# Patient Record
Sex: Female | Born: 1965 | Race: White | Hispanic: No | Smoking: Former smoker
Health system: Southern US, Community
[De-identification: ages and names within clinical notes are randomized; demographics above are authoritative.]

## PROBLEM LIST (undated history)

## (undated) DIAGNOSIS — F17200 Nicotine dependence, unspecified, uncomplicated: Secondary | ICD-10-CM

## (undated) DIAGNOSIS — L719 Rosacea, unspecified: Secondary | ICD-10-CM

## (undated) DIAGNOSIS — T7840XA Allergy, unspecified, initial encounter: Secondary | ICD-10-CM

## (undated) HISTORY — PX: TUBAL LIGATION: SHX77

## (undated) HISTORY — DX: Nicotine dependence, unspecified, uncomplicated: F17.200

## (undated) HISTORY — DX: Rosacea, unspecified: L71.9

## (undated) HISTORY — PX: BUNIONECTOMY: SHX129

## (undated) HISTORY — DX: Allergy, unspecified, initial encounter: T78.40XA

## (undated) HISTORY — PX: TONSILLECTOMY: SUR1361

---

## 2011-04-21 ENCOUNTER — Emergency Department (HOSPITAL_COMMUNITY)
Admission: EM | Admit: 2011-04-21 | Discharge: 2011-04-21 | Disposition: A | Discharge status: Home / Self Care | Payer: No Typology Code available for payment source | Attending: Emergency Medicine | Admitting: Emergency Medicine

## 2011-04-21 ENCOUNTER — Emergency Department (HOSPITAL_COMMUNITY): Payer: No Typology Code available for payment source

## 2011-04-21 ENCOUNTER — Encounter: Payer: Self-pay | Admitting: *Deleted

## 2011-04-21 DIAGNOSIS — S139XXA Sprain of joints and ligaments of unspecified parts of neck, initial encounter: Secondary | ICD-10-CM | POA: Insufficient documentation

## 2011-04-21 DIAGNOSIS — S0510XA Contusion of eyeball and orbital tissues, unspecified eye, initial encounter: Secondary | ICD-10-CM | POA: Insufficient documentation

## 2011-04-21 DIAGNOSIS — M545 Low back pain, unspecified: Secondary | ICD-10-CM | POA: Insufficient documentation

## 2011-04-21 DIAGNOSIS — R220 Localized swelling, mass and lump, head: Secondary | ICD-10-CM | POA: Insufficient documentation

## 2011-04-21 DIAGNOSIS — S0990XA Unspecified injury of head, initial encounter: Secondary | ICD-10-CM | POA: Insufficient documentation

## 2011-04-21 DIAGNOSIS — R51 Headache: Secondary | ICD-10-CM | POA: Insufficient documentation

## 2011-04-21 DIAGNOSIS — S0120XA Unspecified open wound of nose, initial encounter: Secondary | ICD-10-CM | POA: Insufficient documentation

## 2011-04-21 DIAGNOSIS — IMO0002 Reserved for concepts with insufficient information to code with codable children: Secondary | ICD-10-CM | POA: Insufficient documentation

## 2011-04-21 DIAGNOSIS — R040 Epistaxis: Secondary | ICD-10-CM | POA: Insufficient documentation

## 2011-04-21 DIAGNOSIS — S0033XA Contusion of nose, initial encounter: Secondary | ICD-10-CM

## 2011-04-21 DIAGNOSIS — S0003XA Contusion of scalp, initial encounter: Secondary | ICD-10-CM | POA: Insufficient documentation

## 2011-04-21 DIAGNOSIS — M533 Sacrococcygeal disorders, not elsewhere classified: Secondary | ICD-10-CM | POA: Insufficient documentation

## 2011-04-21 DIAGNOSIS — M542 Cervicalgia: Secondary | ICD-10-CM | POA: Insufficient documentation

## 2011-04-21 DIAGNOSIS — H5789 Other specified disorders of eye and adnexa: Secondary | ICD-10-CM | POA: Insufficient documentation

## 2011-04-21 DIAGNOSIS — S161XXA Strain of muscle, fascia and tendon at neck level, initial encounter: Secondary | ICD-10-CM

## 2011-04-21 MED ORDER — DIAZEPAM 5 MG PO TABS
5.0000 mg | ORAL_TABLET | Freq: Once | ORAL | Status: AC
  Administered 2011-04-21: 5 mg via ORAL
  Filled 2011-04-21: qty 1

## 2011-04-21 MED ORDER — HYDROCODONE-ACETAMINOPHEN 5-325 MG PO TABS: 2.0000 | ORAL_TABLET | ORAL | Status: AC | PRN

## 2011-04-21 MED ORDER — OXYCODONE-ACETAMINOPHEN 5-325 MG PO TABS
1.0000 | ORAL_TABLET | Freq: Once | ORAL | Status: AC
  Administered 2011-04-21: 1 via ORAL
  Filled 2011-04-21: qty 1

## 2011-04-21 MED ORDER — DIAZEPAM 5 MG PO TABS: 5.0000 mg | ORAL_TABLET | Freq: Three times a day (TID) | ORAL | Status: AC | PRN

## 2011-04-21 MED ORDER — IBUPROFEN 800 MG PO TABS: 800.0000 mg | ORAL_TABLET | Freq: Three times a day (TID) | ORAL | Status: AC
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
## 2011-04-21 NOTE — ED Notes (Signed)
PT back from CT
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
## 2011-04-21 NOTE — ED Provider Notes (Signed)
Evaluation and management procedures were performed by the PA/NP under my supervision/collaboration.   Dione Booze, MD 04/21/11 2024

## 2014-12-31 DIAGNOSIS — J309 Allergic rhinitis, unspecified: Secondary | ICD-10-CM | POA: Insufficient documentation

## 2014-12-31 DIAGNOSIS — L719 Rosacea, unspecified: Secondary | ICD-10-CM | POA: Insufficient documentation

## 2014-12-31 DIAGNOSIS — F17200 Nicotine dependence, unspecified, uncomplicated: Secondary | ICD-10-CM | POA: Insufficient documentation

## 2015-01-05 ENCOUNTER — Encounter: Payer: Self-pay | Admitting: Unknown Physician Specialty

## 2015-01-05 ENCOUNTER — Ambulatory Visit (INDEPENDENT_AMBULATORY_CARE_PROVIDER_SITE_OTHER): Payer: 59 | Admitting: Unknown Physician Specialty

## 2015-01-05 VITALS — BP 104/72 | HR 67 | Temp 98.3°F | Ht 65.9 in | Wt 141.0 lb

## 2015-01-05 DIAGNOSIS — Z720 Tobacco use: Secondary | ICD-10-CM

## 2015-01-05 DIAGNOSIS — R600 Localized edema: Secondary | ICD-10-CM | POA: Diagnosis not present

## 2015-01-05 DIAGNOSIS — R059 Cough, unspecified: Secondary | ICD-10-CM

## 2015-01-05 DIAGNOSIS — R05 Cough: Secondary | ICD-10-CM | POA: Diagnosis not present

## 2015-01-05 DIAGNOSIS — F17200 Nicotine dependence, unspecified, uncomplicated: Secondary | ICD-10-CM

## 2015-01-05 MED ORDER — AZITHROMYCIN 250 MG PO TABS: ORAL_TABLET | ORAL | Status: DC

## 2015-01-05 NOTE — Progress Notes (Signed)
BP 104/72 mmHg  Pulse 67  Temp(Src) 98.3 F (36.8 C)  Ht 5' 5.9" (1.674 m)  Wt 141 lb (63.957 kg)  BMI 22.82 kg/m2  SpO2 99%  LMP 12/10/2014 (Exact Date)   Subjective:    Patient ID: Susan Calderon, female    DOB: 1966/02/19, 49 y.o.   MRN: [id number]  HPI: Susan Calderon is a 49 y.o. female  Chief Complaint  Patient presents with  . Cough    pt states she had a cold and took multiple things to help, but cant seem to get rid of the cough  . bag under eye    pt states she always gets a bag under her left eye   Cough Chronicity: Started 6 weeks ago with a URI.  She tood a lot of OTC medications.  She still has a cough and thinks she needs antiibiotics. The problem has been unchanged. Episode frequency: comes and goes. The cough is non-productive (occasionally productive of mucous). Pertinent negatives include no chest pain, ear congestion, ear pain, nasal congestion or sore throat. Exacerbated by: activity. She has tried nothing for the symptoms.   She notes "bag" under left eye that comes and goes.  She worries about her nasal cavity being blocked.  Just left eye and she is concerned.  We tried Flonase without any help.    Relevant past medical, surgical, family and social history reviewed and updated as indicated. Interim medical history since our last visit reviewed. Allergies and medications reviewed and updated.  Review of Systems  HENT: Negative for ear pain and sore throat.   Respiratory: Positive for cough.   Cardiovascular: Negative for chest pain.    Per HPI unless specifically indicated above     Objective:    BP 104/72 mmHg  Pulse 67  Temp(Src) 98.3 F (36.8 C)  Ht 5' 5.9" (1.674 m)  Wt 141 lb (63.957 kg)  BMI 22.82 kg/m2  SpO2 99%  LMP 12/10/2014 (Exact Date)  Wt Readings from Last 3 Encounters:  01/05/15 141 lb (63.957 kg)  05/12/14 142 lb (64.411 kg)    Physical Exam  Constitutional: She is oriented to person, place, and time. She appears well-developed  and well-nourished. No distress.  HENT:  Head: Normocephalic and atraumatic.  Eyes: Conjunctivae and lids are normal. Right eye exhibits no discharge. Left eye exhibits no discharge. No scleral icterus.  Cardiovascular: Normal rate and regular rhythm.   Pulmonary/Chest: Effort normal and breath sounds normal. No respiratory distress.  Abdominal: Normal appearance. There is no splenomegaly or hepatomegaly.  Musculoskeletal: Normal range of motion.  Neurological: She is alert and oriented to person, place, and time.  Skin: Skin is intact. No rash noted. No pallor.  Psychiatric: She has a normal mood and affect. Her behavior is normal. Judgment and thought content normal.   Spirometry is negative No results found for this or any previous visit.    Assessment & Plan:   Problem List Items Addressed This Visit      Unprioritized   Tobacco use disorder   Relevant Medications   Multiple Vitamin (MULTIVITAMIN) tablet   vitamin E 200 UNIT capsule   Fish Oil-Cholecalciferol (FISH OIL + D3) 1000-1000 MG-UNIT CAPS   ferrous fumarate (HEMOCYTE - 106 MG FE) 325 (106 FE) MG TABS tablet   Other Relevant Orders   Spirometry with Graph (Completed)   DG Chest 2 View    Other Visit Diagnoses    Cough    -  Primary  Relevant Medications    Multiple Vitamin (MULTIVITAMIN) tablet    vitamin E 200 UNIT capsule    Fish Oil-Cholecalciferol (FISH OIL + D3) 1000-1000 MG-UNIT CAPS    ferrous fumarate (HEMOCYTE - 106 MG FE) 325 (106 FE) MG TABS tablet    azithromycin (ZITHROMAX Z-PAK) 250 MG tablet    Other Relevant Orders    Spirometry with Graph (Completed)    DG Chest 2 View    Facial edema        Pt concerned about a blockage in her sinuses.  No help with Flonase.  Refer to ENT    Relevant Orders    Ambulatory referral to ENT        Follow up plan: Return if symptoms worsen or fail to improve, for Schedule for PE.

## 2015-01-07 ENCOUNTER — Ambulatory Visit
Admission: RE | Admit: 2015-01-07 | Discharge: 2015-01-07 | Disposition: A | Discharge status: Home / Self Care | Payer: 59 | Source: Ambulatory Visit | Attending: Unknown Physician Specialty | Admitting: Unknown Physician Specialty

## 2015-01-07 DIAGNOSIS — Z720 Tobacco use: Secondary | ICD-10-CM | POA: Insufficient documentation

## 2015-01-07 DIAGNOSIS — R05 Cough: Secondary | ICD-10-CM | POA: Diagnosis present

## 2015-01-07 DIAGNOSIS — F17200 Nicotine dependence, unspecified, uncomplicated: Secondary | ICD-10-CM

## 2015-01-07 DIAGNOSIS — M5134 Other intervertebral disc degeneration, thoracic region: Secondary | ICD-10-CM | POA: Diagnosis not present

## 2015-01-07 DIAGNOSIS — R059 Cough, unspecified: Secondary | ICD-10-CM

## 2015-01-28 ENCOUNTER — Other Ambulatory Visit: Payer: Self-pay | Admitting: Unknown Physician Specialty

## 2015-01-28 NOTE — Telephone Encounter (Signed)
Pt called stated she needs refill on Doxycycline. Pharm is [facility] in [place] on [address]. Thanks.

## 2015-01-29 MED ORDER — DOXYCYCLINE HYCLATE 50 MG PO CAPS: 50.0000 mg | ORAL_CAPSULE | Freq: Two times a day (BID) | ORAL | Status: DC

## 2015-01-29 NOTE — Telephone Encounter (Signed)
Patient was last seen 01/05/15 and pharmacy is listed in the chart.

## 2015-03-26 ENCOUNTER — Encounter: Payer: Self-pay | Admitting: Unknown Physician Specialty

## 2015-03-26 ENCOUNTER — Ambulatory Visit (INDEPENDENT_AMBULATORY_CARE_PROVIDER_SITE_OTHER): Payer: 59 | Admitting: Unknown Physician Specialty

## 2015-03-26 VITALS — BP 123/79 | HR 61 | Temp 98.2°F | Ht 65.25 in | Wt 136.6 lb

## 2015-03-26 DIAGNOSIS — J42 Unspecified chronic bronchitis: Secondary | ICD-10-CM | POA: Diagnosis not present

## 2015-03-26 DIAGNOSIS — J209 Acute bronchitis, unspecified: Secondary | ICD-10-CM | POA: Diagnosis not present

## 2015-03-26 DIAGNOSIS — J449 Chronic obstructive pulmonary disease, unspecified: Secondary | ICD-10-CM

## 2015-03-26 DIAGNOSIS — R05 Cough: Secondary | ICD-10-CM

## 2015-03-26 DIAGNOSIS — R059 Cough, unspecified: Secondary | ICD-10-CM

## 2015-03-26 MED ORDER — ALBUTEROL SULFATE (2.5 MG/3ML) 0.083% IN NEBU: 2.5000 mg | INHALATION_SOLUTION | Freq: Four times a day (QID) | RESPIRATORY_TRACT | Status: DC | PRN

## 2015-03-26 MED ORDER — PREDNISONE 20 MG PO TABS: 20.0000 mg | ORAL_TABLET | Freq: Every day | ORAL | Status: DC

## 2015-03-26 MED ORDER — AZITHROMYCIN 250 MG PO TABS: ORAL_TABLET | ORAL | Status: DC

## 2015-03-26 MED ORDER — HYDROCOD POLST-CPM POLST ER 10-8 MG/5ML PO SUER: 5.0000 mL | Freq: Two times a day (BID) | ORAL | Status: DC | PRN

## 2015-03-26 NOTE — Progress Notes (Signed)
BP 123/79 mmHg  Pulse 61  Temp(Src) 98.2 F (36.8 C)  Ht 5' 5.25" (1.657 m)  Wt 136 lb 9.6 oz (61.961 kg)  BMI 22.57 kg/m2  SpO2 97%  LMP 03/10/2015 (Approximate)   Subjective:    Patient ID: Susan Calderon, female    DOB: 06/20/1965, 49 y.o.   MRN: [id number]  HPI: Susan Calderon is a 49 y.o. female  Chief Complaint  Patient presents with  . Nasal Congestion  . Cough    Productive cough. Sputum is yellow in color.   Cough This is a new problem. The current episode started 1 to 4 weeks ago. The problem has been gradually improving. The cough is non-productive. Associated symptoms include nasal congestion and postnasal drip. Pertinent negatives include no chest pain, chills, ear congestion, ear pain, fever, headaches, myalgias, sore throat or shortness of breath. Nothing aggravates the symptoms. She has tried OTC cough suppressant for the symptoms. The treatment provided mild relief.   Mild COPD noted on last Spirometry  Relevant past medical, surgical, family and social history reviewed and updated as indicated. Interim medical history since our last visit reviewed. Allergies and medications reviewed and updated.  Review of Systems  Constitutional: Negative for fever and chills.  HENT: Positive for postnasal drip. Negative for ear pain and sore throat.   Respiratory: Positive for cough. Negative for shortness of breath.   Cardiovascular: Negative for chest pain.  Musculoskeletal: Negative for myalgias.  Neurological: Negative for headaches.    Per HPI unless specifically indicated above     Objective:    BP 123/79 mmHg  Pulse 61  Temp(Src) 98.2 F (36.8 C)  Ht 5' 5.25" (1.657 m)  Wt 136 lb 9.6 oz (61.961 kg)  BMI 22.57 kg/m2  SpO2 97%  LMP 03/10/2015 (Approximate)  Wt Readings from Last 3 Encounters:  03/26/15 136 lb 9.6 oz (61.961 kg)  01/05/15 141 lb (63.957 kg)  05/12/14 142 lb (64.411 kg)    Physical Exam  Constitutional: She is oriented to person, place, and  time. She appears well-developed and well-nourished. No distress.  HENT:  Head: Normocephalic and atraumatic.  Eyes: Conjunctivae and lids are normal. Right eye exhibits no discharge. Left eye exhibits no discharge. No scleral icterus.  Cardiovascular: Normal rate, regular rhythm and normal heart sounds.   Pulmonary/Chest: Effort normal. No respiratory distress. She has wheezes.  Wheezing throughout with decreased breath sounds  Abdominal: Normal appearance. There is no splenomegaly or hepatomegaly.  Musculoskeletal: Normal range of motion.  Neurological: She is alert and oriented to person, place, and time.  Skin: Skin is intact. No rash noted. No pallor.  Psychiatric: She has a normal mood and affect. Her behavior is normal. Judgment and thought content normal.    No results found for this or any previous visit.    Assessment & Plan:   Problem List Items Addressed This Visit      Unprioritized   COPD, mild (HCC)    Albuterol prn.  Encouraged to quit smoking.        Relevant Medications   azelastine (ASTELIN) 0.1 % nasal spray   azithromycin (ZITHROMAX Z-PAK) 250 MG tablet   albuterol (PROVENTIL) (2.5 MG/3ML) 0.083% nebulizer solution   predniSONE (DELTASONE) 20 MG tablet    Other Visit Diagnoses    Acute exacerbation of chronic bronchitis (HCC)    -  Primary    Rx for z pack and Tussionex for cough.  Start Albuterol inhaler to use every 4-6 hours prn SOB  Relevant Medications    azelastine (ASTELIN) 0.1 % nasal spray    azithromycin (ZITHROMAX Z-PAK) 250 MG tablet    albuterol (PROVENTIL) (2.5 MG/3ML) 0.083% nebulizer solution    predniSONE (DELTASONE) 20 MG tablet        Follow up plan: No Follow-up on file.

## 2015-03-26 NOTE — Assessment & Plan Note (Signed)
Albuterol prn.  Encouraged to quit smoking.

## 2015-04-01 ENCOUNTER — Telehealth: Payer: Self-pay | Admitting: Unknown Physician Specialty

## 2015-04-01 NOTE — Telephone Encounter (Signed)
Pt would like to know if she could have another type of cough medicine because even after the antibiotics she still has the cough.

## 2015-04-02 NOTE — Telephone Encounter (Signed)
She can pick up OTC cough syrup or get the codeine cough syrup from behind the counter (OTC)

## 2015-04-02 NOTE — Telephone Encounter (Signed)
Routing to provider  

## 2015-04-05 NOTE — Telephone Encounter (Signed)
Called and spoke to a gentleman who stated that the patient was out of town visiting family. He stated that she would be back around lunch time tomorrow so I will try to call her back tomorrow afternoon.

## 2015-04-06 NOTE — Telephone Encounter (Signed)
Called and spoke to patient. I let her know what Dr. Laural BenesJohnson suggested and I told her to please give us a call back if she is still having the cough after taking the cough medicine for a few days.

## 2015-04-07 ENCOUNTER — Other Ambulatory Visit: Payer: Self-pay | Admitting: Unknown Physician Specialty

## 2015-04-08 ENCOUNTER — Telehealth: Payer: Self-pay | Admitting: Unknown Physician Specialty

## 2015-04-08 NOTE — Telephone Encounter (Signed)
Called pt left voicemail to return call and schedule an appointment. Thanks.

## 2015-04-08 NOTE — Telephone Encounter (Signed)
-----   Message from Gabriel Cirriheryl Wicker, NP sent at 04/08/2015  8:18 AM EDT ----- Regarding: Pt needs to be seen I received a request for Prednisone refill for this patient.  If she needs another round of Prednisone, she needs to be seen.  Please set her up an appointment.  Thanks.  I will be there early afternoon if she can't come Friday.

## 2015-05-14 ENCOUNTER — Telehealth: Payer: Self-pay | Admitting: Unknown Physician Specialty

## 2015-05-14 ENCOUNTER — Ambulatory Visit (INDEPENDENT_AMBULATORY_CARE_PROVIDER_SITE_OTHER): Payer: 59 | Admitting: Unknown Physician Specialty

## 2015-05-14 ENCOUNTER — Encounter: Payer: Self-pay | Admitting: Unknown Physician Specialty

## 2015-05-14 VITALS — BP 114/73 | HR 87 | Temp 97.5°F | Ht 65.8 in | Wt 136.6 lb

## 2015-05-14 DIAGNOSIS — M791 Myalgia, unspecified site: Secondary | ICD-10-CM

## 2015-05-14 DIAGNOSIS — M7070 Other bursitis of hip, unspecified hip: Secondary | ICD-10-CM

## 2015-05-14 DIAGNOSIS — Z0000 Encounter for general adult medical examination without abnormal findings: Secondary | ICD-10-CM

## 2015-05-14 DIAGNOSIS — F17200 Nicotine dependence, unspecified, uncomplicated: Secondary | ICD-10-CM

## 2015-05-14 MED ORDER — MELOXICAM 15 MG PO TABS: 15.0000 mg | ORAL_TABLET | Freq: Every day | ORAL | Status: DC

## 2015-05-14 NOTE — Progress Notes (Signed)
BP 114/73 mmHg  Pulse 87  Temp(Src) 97.5 F (36.4 C)  Ht 5' 5.8" (1.671 m)  Wt 136 lb 9.6 oz (61.961 kg)  BMI 22.19 kg/m2  SpO2 96%  LMP 05/11/2015 (Exact Date)   Subjective:    Patient ID: Susan Calderon, female    DOB: 11-13-1965, 49 y.o.   MRN: [id number]  HPI: Susan Calderon is a 49 y.o. female  Chief Complaint  Patient presents with  . Annual Exam  . Sciatica    pt states her legs are in pain because of her sciatic nerve  . Numbness    pt states her arms are falling asleep while she sleeps    Relevant past medical, surgical, family and social history reviewed and updated as indicated. Interim medical history since our last visit reviewed. Allergies and medications reviewed and updated.  Review of Systems  Constitutional: Negative.   HENT: Negative.   Eyes: Negative.   Respiratory: Negative.   Cardiovascular: Negative.   Gastrointestinal: Negative.   Endocrine: Negative.   Genitourinary: Negative.   Musculoskeletal: Negative.        Bilateral lateral hip pain that radiates.  Worse when lying on it.  Having bilateral arm numbness.  Hurts when first getting up  Skin: Negative.   Allergic/Immunologic: Negative.   Neurological: Negative.   Hematological: Negative.   Psychiatric/Behavioral: Negative.     Per HPI unless specifically indicated above     Objective:    BP 114/73 mmHg  Pulse 87  Temp(Src) 97.5 F (36.4 C)  Ht 5' 5.8" (1.671 m)  Wt 136 lb 9.6 oz (61.961 kg)  BMI 22.19 kg/m2  SpO2 96%  LMP 05/11/2015 (Exact Date)  Wt Readings from Last 3 Encounters:  05/14/15 136 lb 9.6 oz (61.961 kg)  03/26/15 136 lb 9.6 oz (61.961 kg)  01/05/15 141 lb (63.957 kg)    Physical Exam  Constitutional: She is oriented to person, place, and time. She appears well-developed and well-nourished.  HENT:  Head: Normocephalic and atraumatic.  Eyes: Pupils are equal, round, and reactive to light. Right eye exhibits no discharge. Left eye exhibits no discharge. No scleral  icterus.  Neck: Normal range of motion. Neck supple. Carotid bruit is not present. No thyromegaly present.  Cardiovascular: Normal rate, regular rhythm and normal heart sounds.  Exam reveals no gallop and no friction rub.   No murmur heard. Pulmonary/Chest: Effort normal and breath sounds normal. No respiratory distress. She has no wheezes. She has no rales.  Abdominal: Soft. Bowel sounds are normal. There is no tenderness. There is no rebound.  Genitourinary: No breast swelling, tenderness or discharge.  Musculoskeletal: Normal range of motion.  Tender bilateral bursae  Lymphadenopathy:    She has no cervical adenopathy.  Neurological: She is alert and oriented to person, place, and time.  Skin: Skin is warm, dry and intact. No rash noted.  Psychiatric: She has a normal mood and affect. Her speech is normal and behavior is normal. Judgment and thought content normal. Cognition and memory are normal.    No results found for this or any previous visit.    Assessment & Plan:   Problem List Items Addressed This Visit      Unprioritized   Tobacco use disorder    Other Visit Diagnoses    Routine general medical examination at a health care facility    -  Primary    Relevant Orders    Comprehensive metabolic panel    CBC    HIV  antibody    Lipid Panel w/o Chol/HDL Ratio    TSH    VITAMIN D 25 Hydroxy (Vit-D Deficiency, Fractures)    Hip bursitis, unspecified laterality        RTC for cortisone injection    Myalgia        Relevant Orders    VITAMIN D 25 Hydroxy (Vit-D Deficiency, Fractures)    Vitamin B12        Follow up plan: Return for cortisone injection hip 30 minutes.

## 2015-05-14 NOTE — Telephone Encounter (Signed)
Called and left patient a voicemail letting her know that a medication was called in for her.

## 2015-05-14 NOTE — Telephone Encounter (Signed)
Routing to provider. Patient was just seen this morning.

## 2015-05-14 NOTE — Telephone Encounter (Signed)
I ordered Meloxicam

## 2015-05-14 NOTE — Assessment & Plan Note (Signed)
Planning on quitting

## 2015-05-14 NOTE — Telephone Encounter (Signed)
Pt would like to know if she could have something called in to [facility] on [address] for pain

## 2015-05-14 NOTE — Patient Instructions (Signed)
Hip Bursitis Bursitis is a swelling and soreness (inflammation) of a fluid-filled sac (bursa). This sac overlies and protects the joints.  CAUSES   Injury.  Overuse of the muscles surrounding the joint.  Arthritis.  Gout.  Infection.  Cold weather.  Inadequate warm-up and conditioning prior to activities. The cause may not be known.  SYMPTOMS   Mild to severe irritation.  Tenderness and swelling over the outside of the hip.  Pain with motion of the hip.  If the bursa becomes infected, a fever may be present. Redness, tenderness, and warmth will develop over the hip. Symptoms usually lessen in 3 to 4 weeks with treatment, but can come back. TREATMENT If conservative treatment does not work, your caregiver may advise draining the bursa and injecting cortisone into the area. This may speed up the healing process. This may also be used as an initial treatment of choice. HOME CARE INSTRUCTIONS   Apply ice to the affected area for 15-20 minutes every 3 to 4 hours while awake for the first 2 days. Put the ice in a plastic bag and place a towel between the bag of ice and your skin.  Rest the painful joint as much as possible, but continue to put the joint through a normal range of motion at least 4 times per day. When the pain lessens, begin normal, slow movements and usual activities to help prevent stiffness of the hip.  Only take over-the-counter or prescription medicines for pain, discomfort, or fever as directed by your caregiver.  Use crutches to limit weight bearing on the hip joint, if advised.  Elevate your painful hip to reduce swelling. Use pillows for propping and cushioning your legs and hips.  Gentle massage may provide comfort and decrease swelling. SEEK IMMEDIATE MEDICAL CARE IF:   Your pain increases even during treatment, or you are not improving.  You have a fever.  You have heat and inflammation over the involved bursa.  You have any other questions or  concerns. MAKE SURE YOU:   Understand these instructions.  Will watch your condition.  Will get help right away if you are not doing well or get worse.   This information is not intended to replace advice given to you by your health care provider. Make sure you discuss any questions you have with your health care provider.   Document Released: 11/18/2001 Document Revised: 08/21/2011 Document Reviewed: 12/29/2014 Elsevier Interactive Patient Education 2016 [employer]  

## 2015-05-15 LAB — COMPREHENSIVE METABOLIC PANEL
A/G RATIO: 2.5 (ref 1.1–2.5)
ALK PHOS: 61 IU/L (ref 39–117)
ALT: 11 IU/L (ref 0–32)
AST: 15 IU/L (ref 0–40)
Albumin: 4.5 g/dL (ref 3.5–5.5)
BILIRUBIN TOTAL: 0.2 mg/dL (ref 0.0–1.2)
BUN/Creatinine Ratio: 15 (ref 9–23)
BUN: 12 mg/dL (ref 6–24)
CHLORIDE: 102 mmol/L (ref 97–106)
CO2: 24 mmol/L (ref 18–29)
Calcium: 9.2 mg/dL (ref 8.7–10.2)
Creatinine, Ser: 0.81 mg/dL (ref 0.57–1.00)
GFR calc Af Amer: 99 mL/min/{1.73_m2} (ref >59)
GFR calc non Af Amer: 86 mL/min/{1.73_m2} (ref >59)
GLUCOSE: 56 mg/dL — AB (ref 65–99)
Globulin, Total: 1.8 g/dL (ref 1.5–4.5)
POTASSIUM: 3.8 mmol/L (ref 3.5–5.2)
Sodium: 143 mmol/L (ref 136–144)
TOTAL PROTEIN: 6.3 g/dL (ref 6.0–8.5)

## 2015-05-15 LAB — CBC
HEMATOCRIT: 38.9 % (ref 34.0–46.6)
HEMOGLOBIN: 13.3 g/dL (ref 11.1–15.9)
MCH: 32.4 pg (ref 26.6–33.0)
MCHC: 34.2 g/dL (ref 31.5–35.7)
MCV: 95 fL (ref 79–97)
Platelets: 231 10*3/uL (ref 150–379)
RBC: 4.1 x10E6/uL (ref 3.77–5.28)
RDW: 12.7 % (ref 12.3–15.4)
WBC: 3.6 10*3/uL (ref 3.4–10.8)

## 2015-05-15 LAB — TSH: TSH: 2.08 u[IU]/mL (ref 0.450–4.500)

## 2015-05-15 LAB — LIPID PANEL W/O CHOL/HDL RATIO
CHOLESTEROL TOTAL: 153 mg/dL (ref 100–199)
HDL: 57 mg/dL (ref >39)
LDL CALC: 69 mg/dL (ref 0–99)
TRIGLYCERIDES: 133 mg/dL (ref 0–149)
VLDL CHOLESTEROL CAL: 27 mg/dL (ref 5–40)

## 2015-05-15 LAB — VITAMIN B12: VITAMIN B 12: 1979 pg/mL — AB (ref 211–946)

## 2015-05-15 LAB — HIV ANTIBODY (ROUTINE TESTING W REFLEX): HIV SCREEN 4TH GENERATION: NONREACTIVE

## 2015-05-15 LAB — VITAMIN D 25 HYDROXY (VIT D DEFICIENCY, FRACTURES): Vit D, 25-Hydroxy: 46 ng/mL (ref 30.0–100.0)

## 2015-05-17 ENCOUNTER — Encounter: Payer: Self-pay | Admitting: Unknown Physician Specialty

## 2015-05-17 NOTE — Progress Notes (Signed)
Quick Note:  Normal labs. Patient notified by letter. Decrease B12 supplement ______

## 2015-06-11 ENCOUNTER — Ambulatory Visit (INDEPENDENT_AMBULATORY_CARE_PROVIDER_SITE_OTHER): Payer: 59 | Admitting: Unknown Physician Specialty

## 2015-06-11 ENCOUNTER — Encounter: Payer: Self-pay | Admitting: Unknown Physician Specialty

## 2015-06-11 VITALS — BP 110/75 | HR 70 | Temp 97.5°F | Ht 65.3 in | Wt 139.2 lb

## 2015-06-11 DIAGNOSIS — M7072 Other bursitis of hip, left hip: Secondary | ICD-10-CM

## 2015-06-11 DIAGNOSIS — M7071 Other bursitis of hip, right hip: Secondary | ICD-10-CM | POA: Insufficient documentation

## 2015-06-11 NOTE — Progress Notes (Signed)
BP 110/75 mmHg  Pulse 70  Temp(Src) 97.5 F (36.4 C)  Ht 5' 5.3" (1.659 m)  Wt 139 lb 3.2 oz (63.141 kg)  BMI 22.94 kg/m2  SpO2 98%  LMP 06/07/2015 (Exact Date)   Subjective:    Patient ID: Susan Calderon, female    DOB: [birth year], 49 y.o.   MRN: [id number]  HPI: Susan Calderon is a 49 y.o. female  Chief Complaint  Patient presents with  . Hip Pain    pt states she has bilateral hip pain, wants cortisone injections   Pt with bilateral bursitis pain as noted last visit.  This has been going on for years with pain lateral hip and radiating to knee.  Left hip is worse than right.  Meloxicam not helping. Pt is here for steroid injection bilateral hip bursae to help with pain.  Relevant past medical, surgical, family and social history reviewed and updated as indicated. Interim medical history since our last visit reviewed. Allergies and medications reviewed and updated.  Review of Systems  Per HPI unless specifically indicated above     Objective:    BP 110/75 mmHg  Pulse 70  Temp(Src) 97.5 F (36.4 C)  Ht 5' 5.3" (1.659 m)  Wt 139 lb 3.2 oz (63.141 kg)  BMI 22.94 kg/m2  SpO2 98%  LMP 06/07/2015 (Exact Date)  Wt Readings from Last 3 Encounters:  06/11/15 139 lb 3.2 oz (63.141 kg)  05/14/15 136 lb 9.6 oz (61.961 kg)  03/26/15 136 lb 9.6 oz (61.961 kg)    Physical Exam  Constitutional: She is oriented to person, place, and time. She appears well-developed and well-nourished. No distress.  HENT:  Head: Normocephalic and atraumatic.  Eyes: Conjunctivae and lids are normal. Right eye exhibits no discharge. Left eye exhibits no discharge. No scleral icterus.  Cardiovascular: Normal rate.   Pulmonary/Chest: Effort normal.  Abdominal: Normal appearance. There is no splenomegaly or hepatomegaly.  Musculoskeletal: Normal range of motion.  Neurological: She is alert and oriented to person, place, and time.  Skin: Skin is intact. No rash noted. No pallor.  Psychiatric: She has a  normal mood and affect. Her behavior is normal. Judgment and thought content normal.   Patient was placed in side-lying position Point of maximum tenderness identified around greater trochanter.   After sterile prep, 1 cc of K40 with 9 cc's of Celtasone injected at point of maximum tenderness.   Patient tolerated the procedure well Procedure done for both hips.     Results for orders placed or performed in visit on 05/14/15  Comprehensive metabolic panel  Result Value Ref Range   Glucose 56 (L) 65 - 99 mg/dL   BUN 12 6 - 24 mg/dL   Creatinine, Ser 4.09 0.57 - 1.00 mg/dL   GFR calc non Af Amer 86 >59 mL/min/1.73   GFR calc Af Amer 99 >59 mL/min/1.73   BUN/Creatinine Ratio 15 9 - 23   Sodium 143 136 - 144 mmol/L   Potassium 3.8 3.5 - 5.2 mmol/L   Chloride 102 97 - 106 mmol/L   CO2 24 18 - 29 mmol/L   Calcium 9.2 8.7 - 10.2 mg/dL   Total Protein 6.3 6.0 - 8.5 g/dL   Albumin 4.5 3.5 - 5.5 g/dL   Globulin, Total 1.8 1.5 - 4.5 g/dL   Albumin/Globulin Ratio 2.5 1.1 - 2.5   Bilirubin Total 0.2 0.0 - 1.2 mg/dL   Alkaline Phosphatase 61 39 - 117 IU/L   AST 15 0 - 40 IU/L  ALT 11 0 - 32 IU/L  CBC  Result Value Ref Range   WBC 3.6 3.4 - 10.8 x10E3/uL   RBC 4.10 3.77 - 5.28 x10E6/uL   Hemoglobin 13.3 11.1 - 15.9 g/dL   Hematocrit 16.138.9 09.634.0 - 46.6 %   MCV 95 79 - 97 fL   MCH 32.4 26.6 - 33.0 pg   MCHC 34.2 31.5 - 35.7 g/dL   RDW 04.512.7 40.912.3 - 81.115.4 %   Platelets 231 150 - 379 x10E3/uL  HIV antibody  Result Value Ref Range   HIV Screen 4th Generation wRfx Non Reactive Non Reactive  Lipid Panel w/o Chol/HDL Ratio  Result Value Ref Range   Cholesterol, Total 153 100 - 199 mg/dL   Triglycerides 914133 0 - 149 mg/dL   HDL 57 >78>39 mg/dL   VLDL Cholesterol Cal 27 5 - 40 mg/dL   LDL Calculated 69 0 - 99 mg/dL  TSH  Result Value Ref Range   TSH 2.080 0.450 - 4.500 uIU/mL  VITAMIN D 25 Hydroxy (Vit-D Deficiency, Fractures)  Result Value Ref Range   Vit D, 25-Hydroxy 46.0 30.0 - 100.0 ng/mL   Vitamin B12  Result Value Ref Range   Vitamin B-12 1979 (H) 211 - 946 pg/mL      Assessment & Plan:   Problem List Items Addressed This Visit      Unprioritized   Bilateral hip bursitis - Primary       Follow up plan: Return if symptoms worsen or fail to improve.

## 2015-09-09 ENCOUNTER — Other Ambulatory Visit: Payer: Self-pay | Admitting: Unknown Physician Specialty

## 2015-09-09 NOTE — Telephone Encounter (Signed)
Your patient 

## 2015-09-10 ENCOUNTER — Other Ambulatory Visit: Payer: Self-pay | Admitting: Unknown Physician Specialty

## 2015-09-10 NOTE — Telephone Encounter (Signed)
Your patient 

## 2015-09-10 NOTE — Telephone Encounter (Signed)
Patient called and stated she would like her doxycycline refilled.

## 2015-09-10 NOTE — Telephone Encounter (Signed)
Called and left patient a voicemail letting her know that Elnita MaxwellCheryl refilled her medication but she would like to see her for a f/u. I asked for her to please return our call to schedule that visit.

## 2015-09-10 NOTE — Telephone Encounter (Signed)
OK.  But needs an appt

## 2015-09-13 NOTE — Telephone Encounter (Signed)
Called and left patient a voicemail asking for her to please return my call to schedule a f/u visit.  

## 2015-09-14 NOTE — Telephone Encounter (Signed)
Called and scheduled patient an appointment for 10/22/15.

## 2015-10-22 ENCOUNTER — Encounter: Payer: Self-pay | Admitting: Unknown Physician Specialty

## 2015-10-22 ENCOUNTER — Ambulatory Visit (INDEPENDENT_AMBULATORY_CARE_PROVIDER_SITE_OTHER): Payer: 59 | Admitting: Unknown Physician Specialty

## 2015-10-22 VITALS — BP 118/79 | HR 71 | Temp 97.9°F | Ht 65.0 in | Wt 140.0 lb

## 2015-10-22 DIAGNOSIS — M7071 Other bursitis of hip, right hip: Secondary | ICD-10-CM | POA: Diagnosis not present

## 2015-10-22 DIAGNOSIS — M7072 Other bursitis of hip, left hip: Secondary | ICD-10-CM | POA: Diagnosis not present

## 2015-10-22 DIAGNOSIS — L719 Rosacea, unspecified: Secondary | ICD-10-CM | POA: Diagnosis not present

## 2015-10-22 MED ORDER — TRAMADOL HCL 50 MG PO TABS: 50.0000 mg | ORAL_TABLET | Freq: Every day | ORAL | Status: DC

## 2015-10-22 MED ORDER — AMITRIPTYLINE HCL 10 MG PO TABS: 10.0000 mg | ORAL_TABLET | Freq: Every day | ORAL | Status: DC

## 2015-10-22 MED ORDER — DOXYCYCLINE HYCLATE 50 MG PO CAPS: 50.0000 mg | ORAL_CAPSULE | Freq: Two times a day (BID) | ORAL | Status: DC

## 2015-10-22 NOTE — Assessment & Plan Note (Signed)
Refill of Doxycycline

## 2015-10-22 NOTE — Progress Notes (Signed)
-  BP 118/79 mmHg  Pulse 71  Temp(Src) 97.9 F (36.6 C)  Ht  (1.651 m)  Wt 140 lb (63.504 kg)  BMI 23.30 kg/m2  SpO2 99%  LMP 10/12/2015 (Approximate)   Subjective:    Patient ID: Susan Calderon, female    DOB: 1965/07/05, 50 y.o.   MRN: [id number]  HPI: Susan Calderon is a 50 y.o. female  Chief Complaint  Patient presents with  . Follow-up  . Hip Pain    pt states she would like something for hip pain so she does not have to take so much ibuprofen  . Medication Refill    pt states she would like a year supply of doxycycline   Rosacea Uses Doxycycline BID for treatment.  Nothing else has worked for her.  She would like a refill.    Hip pain Bursitis - hip injection given by me 4-5 months ago and then went to Orthopedics.  She was referred to PT.  This seemed to help for only a day.  She is exercising some more which is helping.  Trouble sleeping and with first standing.  She would like something to help her at night as she gets shooting pain at night.  She is taking 800 mg Ibuprofen 1-2 times/day.  Reviewed notes from orthopedics  Relevant past medical, surgical, family and social history reviewed and updated as indicated. Interim medical history since our last visit reviewed. Allergies and medications reviewed and updated.  Review of Systems  All other systems reviewed and are negative.   Per HPI unless specifically indicated above     Objective:    BP 118/79 mmHg  Pulse 71  Temp(Src) 97.9 F (36.6 C)  Ht  (1.651 m)  Wt 140 lb (63.504 kg)  BMI 23.30 kg/m2  SpO2 99%  LMP 10/12/2015 (Approximate)  Wt Readings from Last 3 Encounters:  10/22/15 140 lb (63.504 kg)  06/11/15 139 lb 3.2 oz (63.141 kg)  05/14/15 136 lb 9.6 oz (61.961 kg)    Physical Exam  Constitutional: She is oriented to person, place, and time. She appears well-developed and well-nourished. No distress.  HENT:  Head: Normocephalic and atraumatic.  Eyes: Conjunctivae and lids are normal. Right  eye exhibits no discharge. Left eye exhibits no discharge. No scleral icterus.  Neck: Normal range of motion. Neck supple. No JVD present. Carotid bruit is not present.  Cardiovascular: Normal rate, regular rhythm and normal heart sounds.   Pulmonary/Chest: Effort normal and breath sounds normal.  Abdominal: Normal appearance. There is no splenomegaly or hepatomegaly.  Musculoskeletal: Normal range of motion.  Neurological: She is alert and oriented to person, place, and time.  Skin: Skin is warm, dry and intact. No rash noted. No pallor.  Psychiatric: She has a normal mood and affect. Her behavior is normal. Judgment and thought content normal.    Results for orders placed or performed in visit on 05/14/15  Comprehensive metabolic panel  Result Value Ref Range   Glucose 56 (L) 65 - 99 mg/dL   BUN 12 6 - 24 mg/dL   Creatinine, Ser 4.09 0.57 - 1.00 mg/dL   GFR calc non Af Amer 86 >59 mL/min/1.73   GFR calc Af Amer 99 >59 mL/min/1.73   BUN/Creatinine Ratio 15 9 - 23   Sodium 143 136 - 144 mmol/L   Potassium 3.8 3.5 - 5.2 mmol/L   Chloride 102 97 - 106 mmol/L   CO2 24 18 - 29 mmol/L   Calcium 9.2 8.7 -  10.2 mg/dL   Total Protein 6.3 6.0 - 8.5 g/dL   Albumin 4.5 3.5 - 5.5 g/dL   Globulin, Total 1.8 1.5 - 4.5 g/dL   Albumin/Globulin Ratio 2.5 1.1 - 2.5   Bilirubin Total 0.2 0.0 - 1.2 mg/dL   Alkaline Phosphatase 61 39 - 117 IU/L   AST 15 0 - 40 IU/L   ALT 11 0 - 32 IU/L  CBC  Result Value Ref Range   WBC 3.6 3.4 - 10.8 x10E3/uL   RBC 4.10 3.77 - 5.28 x10E6/uL   Hemoglobin 13.3 11.1 - 15.9 g/dL   Hematocrit 16.138.9 09.634.0 - 46.6 %   MCV 95 79 - 97 fL   MCH 32.4 26.6 - 33.0 pg   MCHC 34.2 31.5 - 35.7 g/dL   RDW 04.512.7 40.912.3 - 81.115.4 %   Platelets 231 150 - 379 x10E3/uL  HIV antibody  Result Value Ref Range   HIV Screen 4th Generation wRfx Non Reactive Non Reactive  Lipid Panel w/o Chol/HDL Ratio  Result Value Ref Range   Cholesterol, Total 153 100 - 199 mg/dL   Triglycerides 914133 0 -  149 mg/dL   HDL 57 >78>39 mg/dL   VLDL Cholesterol Cal 27 5 - 40 mg/dL   LDL Calculated 69 0 - 99 mg/dL  TSH  Result Value Ref Range   TSH 2.080 0.450 - 4.500 uIU/mL  VITAMIN D 25 Hydroxy (Vit-D Deficiency, Fractures)  Result Value Ref Range   Vit D, 25-Hydroxy 46.0 30.0 - 100.0 ng/mL  Vitamin B12  Result Value Ref Range   Vitamin B-12 1979 (H) 211 - 946 pg/mL      Assessment & Plan:   Problem List Items Addressed This Visit      Unprioritized   Bilateral hip bursitis    Encouraged the exercises given by PT.  OK for Tramadol 50 mg QHS.  Consider injections.  Pt ed on controlled substance risks.  Rx for Amitriptyline QHS      Rosacea - Primary    Refill of Doxycycline .            Follow up plan: Return in about 6 months (around 04/23/2016).

## 2015-10-22 NOTE — Assessment & Plan Note (Addendum)
Encouraged the exercises given by PT.  OK for Tramadol 50 mg QHS.  Consider injections.  Pt ed on controlled substance risks.  Rx for Amitriptyline QHS

## 2015-11-26 ENCOUNTER — Ambulatory Visit: Payer: Self-pay | Admitting: Unknown Physician Specialty

## 2016-04-28 ENCOUNTER — Telehealth: Payer: Self-pay

## 2016-04-28 ENCOUNTER — Other Ambulatory Visit: Payer: Self-pay

## 2016-04-28 ENCOUNTER — Encounter: Payer: Self-pay | Admitting: Unknown Physician Specialty

## 2016-04-28 ENCOUNTER — Ambulatory Visit (INDEPENDENT_AMBULATORY_CARE_PROVIDER_SITE_OTHER): Payer: 59 | Admitting: Unknown Physician Specialty

## 2016-04-28 VITALS — BP 119/75 | HR 86 | Temp 98.3°F | Ht 65.3 in | Wt 137.0 lb

## 2016-04-28 DIAGNOSIS — M7061 Trochanteric bursitis, right hip: Secondary | ICD-10-CM

## 2016-04-28 DIAGNOSIS — N6320 Unspecified lump in the left breast, unspecified quadrant: Secondary | ICD-10-CM

## 2016-04-28 DIAGNOSIS — Z23 Encounter for immunization: Secondary | ICD-10-CM

## 2016-04-28 DIAGNOSIS — N63 Unspecified lump in unspecified breast: Secondary | ICD-10-CM

## 2016-04-28 DIAGNOSIS — M7062 Trochanteric bursitis, left hip: Secondary | ICD-10-CM | POA: Diagnosis not present

## 2016-04-28 NOTE — Progress Notes (Signed)
BP 119/75 (BP Location: Left Arm, Patient Position: Sitting, Cuff Size: Small)   Pulse 86   Temp 98.3 F (36.8 C)   Ht 5' 5.3" (1.659 m)   Wt 137 lb (62.1 kg)   LMP 04/20/2016 (Approximate)   SpO2 98%   BMI 22.59 kg/m    Subjective:    Patient ID: Susan BundeLynda Coiro, female    DOB: 09/17/1965, 50 y.o.   MRN: [id number]  HPI: Susan Calderon is a 50 y.o. female  Chief Complaint  Patient presents with  . Hip Pain    pt states she has bilateral hip pain, wants cortisone injections  . Breast Mass    pt states she has a lump in left breast she wants check out, states she first noticed it in October    Breast mass Pt with left breast mass she has noted for about 1 month.  States it is left lateral, no changes, and non-tender.    Hip pain Pt with bilateral hip pain.  She has been to Orthopedics and PT.  States she does her PT exercises.  Hurts mostly at night.    Relevant past medical, surgical, family and social history reviewed and updated as indicated. Interim medical history since our last visit reviewed. Allergies and medications reviewed and updated.  Review of Systems  Per HPI unless specifically indicated above     Objective:    BP 119/75 (BP Location: Left Arm, Patient Position: Sitting, Cuff Size: Small)   Pulse 86   Temp 98.3 F (36.8 C)   Ht 5' 5.3" (1.659 m)   Wt 137 lb (62.1 kg)   LMP 04/20/2016 (Approximate)   SpO2 98%   BMI 22.59 kg/m   Wt Readings from Last 3 Encounters:  04/28/16 137 lb (62.1 kg)  10/22/15 140 lb (63.5 kg)  06/11/15 139 lb 3.2 oz (63.1 kg)    Physical Exam  Constitutional: She is oriented to person, place, and time. She appears well-developed and well-nourished. No distress.  HENT:  Head: Normocephalic and atraumatic.  Eyes: Conjunctivae and lids are normal. Right eye exhibits no discharge. Left eye exhibits no discharge. No scleral icterus.  Neck: Normal range of motion. Neck supple. No JVD present. Carotid bruit is not present.    Cardiovascular: Normal rate, regular rhythm and normal heart sounds.   Pulmonary/Chest: Effort normal and breath sounds normal. Left breast exhibits mass. Left breast exhibits no inverted nipple, no nipple discharge, no skin change and no tenderness.    Marble sized mass left breast  Abdominal: Normal appearance. There is no splenomegaly or hepatomegaly.  Musculoskeletal: Normal range of motion.       Right hip: She exhibits bony tenderness. She exhibits normal range of motion.       Left hip: She exhibits bony tenderness. She exhibits normal range of motion.  Bilateral hip pain with maximum tenderness over the bursae.    Neurological: She is alert and oriented to person, place, and time.  Skin: Skin is warm, dry and intact. No rash noted. No pallor.  Psychiatric: She has a normal mood and affect. Her behavior is normal. Judgment and thought content normal.   Patient was placed in side-lying position Point of maximum tenderness identified around greater trochanter.   After sterile prep, 1 cc of K40 with 4 cc's of Celtasone injected at point of maximum tenderness.   Patient tolerated the procedure well Procedure done for both hips.     Results for orders placed or performed in visit  on 05/14/15  Comprehensive metabolic panel  Result Value Ref Range   Glucose 56 (L) 65 - 99 mg/dL   BUN 12 6 - 24 mg/dL   Creatinine, Ser 1.610.81 0.57 - 1.00 mg/dL   GFR calc non Af Amer 86 >59 mL/min/1.73   GFR calc Af Amer 99 >59 mL/min/1.73   BUN/Creatinine Ratio 15 9 - 23   Sodium 143 136 - 144 mmol/L   Potassium 3.8 3.5 - 5.2 mmol/L   Chloride 102 97 - 106 mmol/L   CO2 24 18 - 29 mmol/L   Calcium 9.2 8.7 - 10.2 mg/dL   Total Protein 6.3 6.0 - 8.5 g/dL   Albumin 4.5 3.5 - 5.5 g/dL   Globulin, Total 1.8 1.5 - 4.5 g/dL   Albumin/Globulin Ratio 2.5 1.1 - 2.5   Bilirubin Total 0.2 0.0 - 1.2 mg/dL   Alkaline Phosphatase 61 39 - 117 IU/L   AST 15 0 - 40 IU/L   ALT 11 0 - 32 IU/L  CBC  Result Value  Ref Range   WBC 3.6 3.4 - 10.8 x10E3/uL   RBC 4.10 3.77 - 5.28 x10E6/uL   Hemoglobin 13.3 11.1 - 15.9 g/dL   Hematocrit 09.638.9 04.534.0 - 46.6 %   MCV 95 79 - 97 fL   MCH 32.4 26.6 - 33.0 pg   MCHC 34.2 31.5 - 35.7 g/dL   RDW 40.912.7 81.112.3 - 91.415.4 %   Platelets 231 150 - 379 x10E3/uL  HIV antibody  Result Value Ref Range   HIV Screen 4th Generation wRfx Non Reactive Non Reactive  Lipid Panel w/o Chol/HDL Ratio  Result Value Ref Range   Cholesterol, Total 153 100 - 199 mg/dL   Triglycerides 782133 0 - 149 mg/dL   HDL 57 >95>39 mg/dL   VLDL Cholesterol Cal 27 5 - 40 mg/dL   LDL Calculated 69 0 - 99 mg/dL  TSH  Result Value Ref Range   TSH 2.080 0.450 - 4.500 uIU/mL  VITAMIN D 25 Hydroxy (Vit-D Deficiency, Fractures)  Result Value Ref Range   Vit D, 25-Hydroxy 46.0 30.0 - 100.0 ng/mL  Vitamin B12  Result Value Ref Range   Vitamin B-12 1,979 (H) 211 - 946 pg/mL      Assessment & Plan:   Problem List Items Addressed This Visit      Unprioritized   Bilateral hip bursitis    Injection given.  Discussed with pt to work on her hip exercises she learned from PT       Other Visit Diagnoses    Need for diphtheria-tetanus-pertussis (Tdap) vaccine, adult/adolescent    -  Primary   Relevant Orders   Tdap vaccine greater than or equal to 7yo IM (Completed)   Mass of breast, left       referred to surgery and order given for mammogram   Relevant Orders   Ambulatory referral to General Surgery   MM Digital Diagnostic Bilat       Follow up plan: Return for PE.

## 2016-04-28 NOTE — Assessment & Plan Note (Signed)
Injection given.  Discussed with pt to work on her hip exercises she learned from PT

## 2016-04-28 NOTE — Telephone Encounter (Signed)
Called [facility] to schedule patient's mammogram for a left breast mass. [facility] stated that as of September 1st, they do not do diagnostic mammograms and that the patient would have to go to the [place] office. Called patient and she decided to have the mammogram done at [facility] [place]. Patient stated she wanted the first available early morning on a Thursday or Friday. Earliest appointment they had was December 14th at 10:40 am. Micah FlesherWent ahead and scheduled this for the patient. Called and let patient know that this is the earliest appointment they said they had. I gave the patient [facility]'s phone number in case she wanted to reschedule her appointment. Patient stated she may call and reschedule for a day she has off of work.

## 2016-04-28 NOTE — Patient Instructions (Addendum)
Tdap Vaccine (Tetanus, Diphtheria and Pertussis): What You Need to Know 1. Why get vaccinated? Tetanus, diphtheria and pertussis are very serious diseases. Tdap vaccine can protect us from these diseases. And, Tdap vaccine given to pregnant women can protect newborn babies against pertussis. TETANUS (Lockjaw) is rare in the [place] today. It causes painful muscle tightening and stiffness, usually all over the body.  It can lead to tightening of muscles in the head and neck so you can't open your mouth, swallow, or sometimes even breathe. Tetanus kills about 1 out of 10 people who are infected even after receiving the best medical care.  DIPHTHERIA is also rare in the [place] today. It can cause a thick coating to form in the back of the throat.  It can lead to breathing problems, heart failure, paralysis, and death.  PERTUSSIS (Whooping Cough) causes severe coughing spells, which can cause difficulty breathing, vomiting and disturbed sleep.  It can also lead to weight loss, incontinence, and rib fractures. Up to 2 in 100 adolescents and 5 in 100 adults with pertussis are hospitalized or have complications, which could include pneumonia or death.  These diseases are caused by bacteria. Diphtheria and pertussis are spread from person to person through secretions from coughing or sneezing. Tetanus enters the body through cuts, scratches, or wounds. Before vaccines, as many as 200,000 cases of diphtheria, 200,000 cases of pertussis, and hundreds of cases of tetanus, were reported in the [place] each year. Since vaccination began, reports of cases for tetanus and diphtheria have dropped by about 99% and for pertussis by about 80%. 2. Tdap vaccine Tdap vaccine can protect adolescents and adults from tetanus, diphtheria, and pertussis. One dose of Tdap is routinely given at age 11 or 12. People who did not get Tdap at that age should get it as soon as possible. Tdap is especially  important for healthcare professionals and anyone having close contact with a baby younger than 12 months. Pregnant women should get a dose of Tdap during every pregnancy, to protect the newborn from pertussis. Infants are most at risk for severe, life-threatening complications from pertussis. Another vaccine, called Td, protects against tetanus and diphtheria, but not pertussis. A Td booster should be given every 10 years. Tdap may be given as one of these boosters if you have never gotten Tdap before. Tdap may also be given after a severe cut or burn to prevent tetanus infection. Your doctor or the person giving you the vaccine can give you more information. Tdap may safely be given at the same time as other vaccines. 3. Some people should not get this vaccine  A person who has ever had a life-threatening allergic reaction after a previous dose of any diphtheria, tetanus or pertussis containing vaccine, OR has a severe allergy to any part of this vaccine, should not get Tdap vaccine. Tell the person giving the vaccine about any severe allergies.  Anyone who had coma or long repeated seizures within 7 days after a childhood dose of DTP or DTaP, or a previous dose of Tdap, should not get Tdap, unless a cause other than the vaccine was found. They can still get Td.  Talk to your doctor if you: ? have seizures or another nervous system problem, ? had severe pain or swelling after any vaccine containing diphtheria, tetanus or pertussis, ? ever had a condition called Guillain-Barr Syndrome (GBS), ? aren't feeling well on the day the shot is scheduled. 4. Risks With any medicine, including   vaccines, there is a chance of side effects. These are usually mild and go away on their own. Serious reactions are also possible but are rare. Most people who get Tdap vaccine do not have any problems with it. Mild problems following Tdap: (Did not interfere with activities)  Pain where the shot was given (about  3 in 4 adolescents or 2 in 3 adults)  Redness or swelling where the shot was given (about 1 person in 5)  Mild fever of at least 100.4F (up to about 1 in 25 adolescents or 1 in 100 adults)  Headache (about 3 or 4 people in 10)  Tiredness (about 1 person in 3 or 4)  Nausea, vomiting, diarrhea, stomach ache (up to 1 in 4 adolescents or 1 in 10 adults)  Chills, sore joints (about 1 person in 10)  Body aches (about 1 person in 3 or 4)  Rash, swollen glands (uncommon)  Moderate problems following Tdap: (Interfered with activities, but did not require medical attention)  Pain where the shot was given (up to 1 in 5 or 6)  Redness or swelling where the shot was given (up to about 1 in 16 adolescents or 1 in 12 adults)  Fever over 102F (about 1 in 100 adolescents or 1 in 250 adults)  Headache (about 1 in 7 adolescents or 1 in 10 adults)  Nausea, vomiting, diarrhea, stomach ache (up to 1 or 3 people in 100)  Swelling of the entire arm where the shot was given (up to about 1 in 500).  Severe problems following Tdap: (Unable to perform usual activities; required medical attention)  Swelling, severe pain, bleeding and redness in the arm where the shot was given (rare).  Problems that could happen after any vaccine:  People sometimes faint after a medical procedure, including vaccination. Sitting or lying down for about 15 minutes can help prevent fainting, and injuries caused by a fall. Tell your doctor if you feel dizzy, or have vision changes or ringing in the ears.  Some people get severe pain in the shoulder and have difficulty moving the arm where a shot was given. This happens very rarely.  Any medication can cause a severe allergic reaction. Such reactions from a vaccine are very rare, estimated at fewer than 1 in a million doses, and would happen within a few minutes to a few hours after the vaccination. As with any medicine, there is a very remote chance of a vaccine  causing a serious injury or death. The safety of vaccines is always being monitored. For more information, visit: www.cdc.gov/vaccinesafety/ 5. What if there is a serious problem? What should I look for? Look for anything that concerns you, such as signs of a severe allergic reaction, very high fever, or unusual behavior. Signs of a severe allergic reaction can include hives, swelling of the face and throat, difficulty breathing, a fast heartbeat, dizziness, and weakness. These would usually start a few minutes to a few hours after the vaccination. What should I do?  If you think it is a severe allergic reaction or other emergency that can't wait, call 9-1-1 or get the person to the nearest hospital. Otherwise, call your doctor.  Afterward, the reaction should be reported to the Vaccine Adverse Event Reporting System (VAERS). Your doctor might file this report, or you can do it yourself through the VAERS web site at www.vaers.hhs.gov, or by calling 1-800-822-7967. ? VAERS does not give medical advice. 6. The National Vaccine Injury Compensation Program The National   Vaccine Injury Compensation Program (VICP) is a federal program that was created to compensate people who may have been injured by certain vaccines. Persons who believe they may have been injured by a vaccine can learn about the program and about filing a claim by calling 1-800-338-2382 or visiting the VICP website at www.hrsa.gov/vaccinecompensation. There is a time limit to file a claim for compensation. 7. How can I learn more?  Ask your doctor. He or she can give you the vaccine package insert or suggest other sources of information.  Call your local or state health department.  Contact the Centers for Disease Control and Prevention (CDC): ? Call 1-800-232-4636 (1-800-CDC-INFO) or ? Visit CDC's website at www.cdc.gov/vaccines CDC Tdap Vaccine VIS (08/05/13) This information is not intended to replace advice given to you by your  health care provider. Make sure you discuss any questions you have with your health care provider. Document Released: 11/28/2011 Document Revised: 02/17/2016 Document Reviewed: 02/17/2016 Elsevier Interactive Patient Education  2017 [employer]vier Inc.  

## 2016-05-01 ENCOUNTER — Other Ambulatory Visit: Payer: Self-pay | Admitting: *Deleted

## 2016-05-01 ENCOUNTER — Inpatient Hospital Stay
Admission: RE | Admit: 2016-05-01 | Discharge: 2016-05-01 | Disposition: A | Discharge status: Home / Self Care | Payer: Self-pay | Source: Ambulatory Visit | Attending: *Deleted | Admitting: *Deleted

## 2016-05-01 ENCOUNTER — Encounter: Payer: Self-pay | Admitting: *Deleted

## 2016-05-01 DIAGNOSIS — Z9289 Personal history of other medical treatment: Secondary | ICD-10-CM

## 2016-05-16 ENCOUNTER — Encounter: Payer: Self-pay | Admitting: *Deleted

## 2016-05-17 ENCOUNTER — Ambulatory Visit
Admission: RE | Admit: 2016-05-17 | Discharge: 2016-05-17 | Disposition: A | Discharge status: Home / Self Care | Payer: 59 | Source: Ambulatory Visit | Attending: Unknown Physician Specialty | Admitting: Unknown Physician Specialty

## 2016-05-17 ENCOUNTER — Other Ambulatory Visit: Payer: Self-pay | Admitting: Unknown Physician Specialty

## 2016-05-17 DIAGNOSIS — N63 Unspecified lump in unspecified breast: Secondary | ICD-10-CM

## 2016-05-17 DIAGNOSIS — N6002 Solitary cyst of left breast: Secondary | ICD-10-CM | POA: Insufficient documentation

## 2016-05-17 DIAGNOSIS — N6001 Solitary cyst of right breast: Secondary | ICD-10-CM | POA: Insufficient documentation

## 2016-05-17 DIAGNOSIS — N6320 Unspecified lump in the left breast, unspecified quadrant: Secondary | ICD-10-CM

## 2016-05-22 ENCOUNTER — Ambulatory Visit: Payer: Self-pay | Admitting: General Surgery

## 2016-05-25 ENCOUNTER — Ambulatory Visit: Payer: No Typology Code available for payment source

## 2016-05-25 ENCOUNTER — Other Ambulatory Visit: Payer: 59

## 2016-08-04 ENCOUNTER — Encounter: Payer: 59 | Admitting: Unknown Physician Specialty

## 2016-08-16 ENCOUNTER — Ambulatory Visit (INDEPENDENT_AMBULATORY_CARE_PROVIDER_SITE_OTHER): Payer: 59 | Admitting: Unknown Physician Specialty

## 2016-08-16 ENCOUNTER — Encounter: Payer: Self-pay | Admitting: Unknown Physician Specialty

## 2016-08-16 VITALS — BP 111/72 | HR 88 | Temp 98.2°F | Ht 65.1 in | Wt 138.7 lb

## 2016-08-16 DIAGNOSIS — M7062 Trochanteric bursitis, left hip: Secondary | ICD-10-CM | POA: Diagnosis not present

## 2016-08-16 DIAGNOSIS — M7061 Trochanteric bursitis, right hip: Secondary | ICD-10-CM

## 2016-08-16 DIAGNOSIS — F17200 Nicotine dependence, unspecified, uncomplicated: Secondary | ICD-10-CM

## 2016-08-16 DIAGNOSIS — Z0000 Encounter for general adult medical examination without abnormal findings: Secondary | ICD-10-CM | POA: Diagnosis not present

## 2016-08-16 NOTE — Addendum Note (Signed)
Addended by: Gabriel CirriWICKER, Josyah Achor on: 08/16/2016 09:05 AM   Modules accepted: Orders

## 2016-08-16 NOTE — Assessment & Plan Note (Signed)
States she is cutting back.

## 2016-08-16 NOTE — Progress Notes (Signed)
BP 111/72 (BP Location: Left Arm, Patient Position: Sitting, Cuff Size: Normal)   Pulse 88   Temp 98.2 F (36.8 C)   Ht 5' 5.1" (1.654 m)   Wt 138 lb 11.2 oz (62.9 kg)   LMP 08/05/2016 (Exact Date)   SpO2 99%   BMI 23.01 kg/m    Subjective:    Patient ID: Susan Calderon, female    DOB: December 29, 1965, 51 y.o.   MRN: [id number]  HPI: Susan Calderon is a 51 y.o. female  Chief Complaint  Patient presents with  . Annual Exam   Pt continues with pain bilateral hips.  States her biggest problem is trying to sleep at night.    Social History   Social History  . Marital status: Divorced    Spouse name: N/A  . Number of children: N/A  . Years of education: N/A   Occupational History  . Not on file.   Social History Main Topics  . Smoking status: Current Every Day Smoker    Packs/day: 0.75    Types: Cigarettes  . Smokeless tobacco: Never Used  . Alcohol use Yes     Comment: on occasion  . Drug use: No  . Sexual activity: No   Other Topics Concern  . Not on file   Social History Narrative  . No narrative on file   Family History  Problem Relation Age of Onset  . Hypothyroidism Mother   . Heart attack Father   . CAD Father   . Stroke Maternal Grandmother    Past Medical History:  Diagnosis Date  . Allergy   . Rosacea   . Tobacco use disorder    Past Surgical History:  Procedure Laterality Date  . BUNIONECTOMY    . CESAREAN SECTION    . TONSILLECTOMY    . TUBAL LIGATION       Relevant past medical, surgical, family and social history reviewed and updated as indicated. Interim medical history since our last visit reviewed. Allergies and medications reviewed and updated.  Review of Systems  Per HPI unless specifically indicated above     Objective:    BP 111/72 (BP Location: Left Arm, Patient Position: Sitting, Cuff Size: Normal)   Pulse 88   Temp 98.2 F (36.8 C)   Ht 5' 5.1" (1.654 m)   Wt 138 lb 11.2 oz (62.9 kg)   LMP 08/05/2016 (Exact Date)   SpO2  99%   BMI 23.01 kg/m   Wt Readings from Last 3 Encounters:  08/16/16 138 lb 11.2 oz (62.9 kg)  04/28/16 137 lb (62.1 kg)  10/22/15 140 lb (63.5 kg)    Physical Exam  Constitutional: She is oriented to person, place, and time. She appears well-developed and well-nourished.  HENT:  Head: Normocephalic and atraumatic.  Eyes: Pupils are equal, round, and reactive to light. Right eye exhibits no discharge. Left eye exhibits no discharge. No scleral icterus.  Neck: Normal range of motion. Neck supple. Carotid bruit is not present. No thyromegaly present.  Cardiovascular: Normal rate, regular rhythm and normal heart sounds.  Exam reveals no gallop and no friction rub.   No murmur heard. Pulmonary/Chest: Effort normal and breath sounds normal. No respiratory distress. She has no wheezes. She has no rales.  Abdominal: Soft. Bowel sounds are normal. There is no tenderness. There is no rebound.  Genitourinary: No breast swelling, tenderness or discharge.  Genitourinary Comments: Lumb marble sized left lateral breast.  This is being monitored.    Musculoskeletal: Normal range  of motion.  Lymphadenopathy:    She has no cervical adenopathy.  Neurological: She is alert and oriented to person, place, and time.  Skin: Skin is warm, dry and intact. No rash noted.  Psychiatric: She has a normal mood and affect. Her speech is normal and behavior is normal. Judgment and thought content normal. Cognition and memory are normal.     Assessment & Plan:   Problem List Items Addressed This Visit      Unprioritized   Bilateral hip bursitis    Discussed change in mattress.  Injections prn      Tobacco use disorder    States she is cutting back.         Other Visit Diagnoses    Annual physical exam    -  Primary       Follow up plan: Return if symptoms worsen or fail to improve.

## 2016-08-16 NOTE — Patient Instructions (Signed)
Topper that can be returned [email or web address]   [email or web address]  [email or web address]

## 2016-08-16 NOTE — Assessment & Plan Note (Addendum)
Discussed change in mattress.  Injections prn

## 2016-08-17 LAB — TSH: TSH: 1.38 u[IU]/mL (ref 0.450–4.500)

## 2016-08-17 LAB — COMPREHENSIVE METABOLIC PANEL
ALT: 13 IU/L (ref 0–32)
AST: 12 IU/L (ref 0–40)
Albumin/Globulin Ratio: 2.9 — ABNORMAL HIGH (ref 1.2–2.2)
Albumin: 4.6 g/dL (ref 3.5–5.5)
Alkaline Phosphatase: 62 IU/L (ref 39–117)
BUN/Creatinine Ratio: 14 (ref 9–23)
BUN: 11 mg/dL (ref 6–24)
Bilirubin Total: <0.2 mg/dL (ref 0.0–1.2)
CALCIUM: 9.1 mg/dL (ref 8.7–10.2)
CO2: 24 mmol/L (ref 18–29)
CREATININE: 0.81 mg/dL (ref 0.57–1.00)
Chloride: 103 mmol/L (ref 96–106)
GFR calc Af Amer: 98 mL/min/{1.73_m2} (ref >59)
GFR, EST NON AFRICAN AMERICAN: 85 mL/min/{1.73_m2} (ref >59)
GLOBULIN, TOTAL: 1.6 g/dL (ref 1.5–4.5)
Glucose: 43 mg/dL — ABNORMAL LOW (ref 65–99)
Potassium: 4 mmol/L (ref 3.5–5.2)
SODIUM: 142 mmol/L (ref 134–144)
Total Protein: 6.2 g/dL (ref 6.0–8.5)

## 2016-08-17 LAB — LIPID PANEL W/O CHOL/HDL RATIO
Cholesterol, Total: 156 mg/dL (ref 100–199)
HDL: 72 mg/dL (ref >39)
LDL CALC: 70 mg/dL (ref 0–99)
TRIGLYCERIDES: 70 mg/dL (ref 0–149)
VLDL Cholesterol Cal: 14 mg/dL (ref 5–40)

## 2016-08-17 LAB — CBC WITH DIFFERENTIAL/PLATELET
Basophils Absolute: 0 10*3/uL (ref 0.0–0.2)
Basos: 1 %
EOS (ABSOLUTE): 0.2 10*3/uL (ref 0.0–0.4)
EOS: 6 %
HEMATOCRIT: 38.6 % (ref 34.0–46.6)
HEMOGLOBIN: 12.9 g/dL (ref 11.1–15.9)
IMMATURE GRANS (ABS): 0 10*3/uL (ref 0.0–0.1)
IMMATURE GRANULOCYTES: 0 %
LYMPHS: 31 %
Lymphocytes Absolute: 1.2 10*3/uL (ref 0.7–3.1)
MCH: 31.6 pg (ref 26.6–33.0)
MCHC: 33.4 g/dL (ref 31.5–35.7)
MCV: 95 fL (ref 79–97)
MONOS ABS: 0.6 10*3/uL (ref 0.1–0.9)
Monocytes: 14 %
NEUTROS PCT: 48 %
Neutrophils Absolute: 2 10*3/uL (ref 1.4–7.0)
Platelets: 213 10*3/uL (ref 150–379)
RBC: 4.08 x10E6/uL (ref 3.77–5.28)
RDW: 12.6 % (ref 12.3–15.4)
WBC: 4 10*3/uL (ref 3.4–10.8)

## 2016-08-17 LAB — HIV ANTIBODY (ROUTINE TESTING W REFLEX): HIV Screen 4th Generation wRfx: NONREACTIVE

## 2016-08-17 LAB — VITAMIN D 25 HYDROXY (VIT D DEFICIENCY, FRACTURES): Vit D, 25-Hydroxy: 39.8 ng/mL (ref 30.0–100.0)

## 2016-08-17 LAB — HEPATITIS C ANTIBODY: Hep C Virus Ab: <0.1 s/co ratio (ref 0.0–0.9)

## 2016-08-18 ENCOUNTER — Encounter: Payer: Self-pay | Admitting: Unknown Physician Specialty

## 2016-11-17 ENCOUNTER — Encounter: Payer: Self-pay | Admitting: Unknown Physician Specialty

## 2016-11-17 ENCOUNTER — Ambulatory Visit (INDEPENDENT_AMBULATORY_CARE_PROVIDER_SITE_OTHER): Payer: 59 | Admitting: Unknown Physician Specialty

## 2016-11-17 VITALS — BP 108/70 | HR 66 | Temp 97.9°F | Wt 140.8 lb

## 2016-11-17 DIAGNOSIS — M7062 Trochanteric bursitis, left hip: Secondary | ICD-10-CM

## 2016-11-17 DIAGNOSIS — M7061 Trochanteric bursitis, right hip: Secondary | ICD-10-CM | POA: Diagnosis not present

## 2016-11-17 MED ORDER — TRIAMCINOLONE ACETONIDE 40 MG/ML IJ SUSP
40.0000 mg | Freq: Once | INTRAMUSCULAR | Status: AC
  Administered 2016-11-17: 40 mg

## 2016-11-17 NOTE — Progress Notes (Signed)
BP 108/70   Pulse 66   Temp 97.9 F (36.6 C)   Wt 140 lb 12.8 oz (63.9 kg)   LMP 10/24/2016 (Approximate)   SpO2 99%   BMI 23.36 kg/m    Subjective:    Patient ID: Susan BundeLynda Calderon, female    DOB: 07/28/1965, 51 y.o.   MRN: [id number]  HPI: Susan Calderon is a 51 y.o. female  Chief Complaint  Patient presents with  . Hip Pain    pt states she is here for bilateral hip injections   Pt is here for f/u of hip bursitis bilateral hips well documented in the past.  She has been to PT and Orthopedics.  No real relief besides steroid injections at this office.  Sometimes uses Ibuporfen 800 mg which "barely hits it."  Relevant past medical, surgical, family and social history reviewed and updated as indicated. Interim medical history since our last visit reviewed. Allergies and medications reviewed and updated.  Review of Systems  Per HPI unless specifically indicated above     Objective:    BP 108/70   Pulse 66   Temp 97.9 F (36.6 C)   Wt 140 lb 12.8 oz (63.9 kg)   LMP 10/24/2016 (Approximate)   SpO2 99%   BMI 23.36 kg/m   Wt Readings from Last 3 Encounters:  11/17/16 140 lb 12.8 oz (63.9 kg)  08/16/16 138 lb 11.2 oz (62.9 kg)  04/28/16 137 lb (62.1 kg)    Physical Exam  Constitutional: She is oriented to person, place, and time. She appears well-developed and well-nourished. No distress.  HENT:  Head: Normocephalic and atraumatic.  Eyes: Conjunctivae and lids are normal. Right eye exhibits no discharge. Left eye exhibits no discharge. No scleral icterus.  Cardiovascular: Normal rate.   Pulmonary/Chest: Effort normal.  Abdominal: Normal appearance. There is no splenomegaly or hepatomegaly.  Musculoskeletal: Normal range of motion.  Bilateral lateral tenderness at greater trochanter.    Neurological: She is alert and oriented to person, place, and time.  Skin: Skin is intact. No rash noted. No pallor.  Psychiatric: She has a normal mood and affect. Her behavior is normal.  Judgment and thought content normal.   Patient was placed in side-lying position Point of maximum tenderness identified around greater trochanter.   After sterile prep, 1 cc of K40 with 4 cc's of Celtasone injected at point of maximum tenderness.   Patient tolerated the procedure well Procedure done for both hips.      Results for orders placed or performed in visit on 08/16/16  CBC with Differential/Platelet  Result Value Ref Range   WBC 4.0 3.4 - 10.8 x10E3/uL   RBC 4.08 3.77 - 5.28 x10E6/uL   Hemoglobin 12.9 11.1 - 15.9 g/dL   Hematocrit 40.938.6 81.134.0 - 46.6 %   MCV 95 79 - 97 fL   MCH 31.6 26.6 - 33.0 pg   MCHC 33.4 31.5 - 35.7 g/dL   RDW 91.412.6 78.212.3 - 95.615.4 %   Platelets 213 150 - 379 x10E3/uL   Neutrophils 48 Not Estab. %   Lymphs 31 Not Estab. %   Monocytes 14 Not Estab. %   Eos 6 Not Estab. %   Basos 1 Not Estab. %   Neutrophils Absolute 2.0 1.4 - 7.0 x10E3/uL   Lymphocytes Absolute 1.2 0.7 - 3.1 x10E3/uL   Monocytes Absolute 0.6 0.1 - 0.9 x10E3/uL   EOS (ABSOLUTE) 0.2 0.0 - 0.4 x10E3/uL   Basophils Absolute 0.0 0.0 - 0.2 x10E3/uL  Immature Granulocytes 0 Not Estab. %   Immature Grans (Abs) 0.0 0.0 - 0.1 x10E3/uL  Comprehensive metabolic panel  Result Value Ref Range   Glucose 43 (L) 65 - 99 mg/dL   BUN 11 6 - 24 mg/dL   Creatinine, Ser 1.61 0.57 - 1.00 mg/dL   GFR calc non Af Amer 85 >59 mL/min/1.73   GFR calc Af Amer 98 >59 mL/min/1.73   BUN/Creatinine Ratio 14 9 - 23   Sodium 142 134 - 144 mmol/L   Potassium 4.0 3.5 - 5.2 mmol/L   Chloride 103 96 - 106 mmol/L   CO2 24 18 - 29 mmol/L   Calcium 9.1 8.7 - 10.2 mg/dL   Total Protein 6.2 6.0 - 8.5 g/dL   Albumin 4.6 3.5 - 5.5 g/dL   Globulin, Total 1.6 1.5 - 4.5 g/dL   Albumin/Globulin Ratio 2.9 (H) 1.2 - 2.2   Bilirubin Total <0.2 0.0 - 1.2 mg/dL   Alkaline Phosphatase 62 39 - 117 IU/L   AST 12 0 - 40 IU/L   ALT 13 0 - 32 IU/L  Lipid Panel w/o Chol/HDL Ratio  Result Value Ref Range   Cholesterol, Total 156 100  - 199 mg/dL   Triglycerides 70 0 - 149 mg/dL   HDL 72 >09 mg/dL   VLDL Cholesterol Cal 14 5 - 40 mg/dL   LDL Calculated 70 0 - 99 mg/dL  TSH  Result Value Ref Range   TSH 1.380 0.450 - 4.500 uIU/mL  VITAMIN D 25 Hydroxy (Vit-D Deficiency, Fractures)  Result Value Ref Range   Vit D, 25-Hydroxy 39.8 30.0 - 100.0 ng/mL  HIV antibody  Result Value Ref Range   HIV Screen 4th Generation wRfx Non Reactive Non Reactive  Hepatitis C antibody  Result Value Ref Range   Hep C Virus Ab <0.1 0.0 - 0.9 s/co ratio      Assessment & Plan:   Problem List Items Addressed This Visit      Unprioritized   Bilateral hip bursitis - Primary    F/u any complication of injections      Relevant Medications   triamcinolone acetonide (KENALOG-40) injection 40 mg (Completed) (Start on 11/17/2016  8:30 AM)   triamcinolone acetonide (KENALOG-40) injection 40 mg (Completed) (Start on 11/17/2016  8:30 AM)       Follow up plan: No Follow-up on file.

## 2016-11-17 NOTE — Assessment & Plan Note (Signed)
F/u any complication of injections

## 2016-11-18 ENCOUNTER — Other Ambulatory Visit: Payer: Self-pay | Admitting: Unknown Physician Specialty

## 2016-12-22 ENCOUNTER — Other Ambulatory Visit: Payer: Self-pay | Admitting: Family Medicine

## 2016-12-22 NOTE — Telephone Encounter (Signed)
Routing to provider. No follow up on file. 

## 2017-01-29 ENCOUNTER — Other Ambulatory Visit: Payer: Self-pay | Admitting: Unknown Physician Specialty

## 2017-01-29 MED ORDER — DOXYCYCLINE HYCLATE 50 MG PO CAPS: 50.0000 mg | ORAL_CAPSULE | Freq: Two times a day (BID) | ORAL | 0 refills | Status: DC

## 2017-01-29 NOTE — Telephone Encounter (Signed)
Called and let patient know that her prescription was sent in for her as requested.

## 2017-01-29 NOTE — Telephone Encounter (Signed)
Routing to provider. Patient last seen 11/17/16.

## 2017-01-29 NOTE — Telephone Encounter (Signed)
Pt would like a refill sent for doxycycline sent to [facility] pharmacy.

## 2017-02-27 ENCOUNTER — Other Ambulatory Visit: Payer: Self-pay | Admitting: Unknown Physician Specialty

## 2017-04-11 ENCOUNTER — Other Ambulatory Visit: Payer: Self-pay | Admitting: Family Medicine

## 2017-04-11 NOTE — Telephone Encounter (Signed)
Pt is requesting a refill on the Doxycycline 50mg .  I was not able to locate the needed information or reason for being on this antibiotic to refill it.  Thanks.

## 2017-04-11 NOTE — Telephone Encounter (Signed)
Your patient 

## 2017-04-27 ENCOUNTER — Other Ambulatory Visit: Payer: Self-pay

## 2017-04-27 ENCOUNTER — Emergency Department (HOSPITAL_COMMUNITY)
Admission: EM | Admit: 2017-04-27 | Discharge: 2017-04-27 | Disposition: A | Discharge status: Home / Self Care | Payer: 59 | Attending: Emergency Medicine | Admitting: Emergency Medicine

## 2017-04-27 ENCOUNTER — Encounter (HOSPITAL_COMMUNITY): Payer: Self-pay

## 2017-04-27 ENCOUNTER — Ambulatory Visit: Payer: Self-pay | Admitting: *Deleted

## 2017-04-27 ENCOUNTER — Emergency Department (HOSPITAL_COMMUNITY): Payer: 59

## 2017-04-27 DIAGNOSIS — R079 Chest pain, unspecified: Secondary | ICD-10-CM | POA: Diagnosis not present

## 2017-04-27 DIAGNOSIS — Z79899 Other long term (current) drug therapy: Secondary | ICD-10-CM | POA: Insufficient documentation

## 2017-04-27 DIAGNOSIS — J449 Chronic obstructive pulmonary disease, unspecified: Secondary | ICD-10-CM | POA: Insufficient documentation

## 2017-04-27 DIAGNOSIS — F17210 Nicotine dependence, cigarettes, uncomplicated: Secondary | ICD-10-CM | POA: Insufficient documentation

## 2017-04-27 LAB — CBC
HEMATOCRIT: 40.4 % (ref 36.0–46.0)
HEMOGLOBIN: 13.6 g/dL (ref 12.0–15.0)
MCH: 32.2 pg (ref 26.0–34.0)
MCHC: 33.7 g/dL (ref 30.0–36.0)
MCV: 95.5 fL (ref 78.0–100.0)
Platelets: 198 10*3/uL (ref 150–400)
RBC: 4.23 MIL/uL (ref 3.87–5.11)
RDW: 12.2 % (ref 11.5–15.5)
WBC: 3.7 10*3/uL — ABNORMAL LOW (ref 4.0–10.5)

## 2017-04-27 LAB — BASIC METABOLIC PANEL
Anion gap: 4 — ABNORMAL LOW (ref 5–15)
BUN: 13 mg/dL (ref 6–20)
CALCIUM: 9.2 mg/dL (ref 8.9–10.3)
CHLORIDE: 105 mmol/L (ref 101–111)
CO2: 28 mmol/L (ref 22–32)
Creatinine, Ser: 0.91 mg/dL (ref 0.44–1.00)
GFR calc Af Amer: >60 mL/min (ref >60)
GFR calc non Af Amer: >60 mL/min (ref >60)
GLUCOSE: 130 mg/dL — AB (ref 65–99)
POTASSIUM: 4.1 mmol/L (ref 3.5–5.1)
SODIUM: 137 mmol/L (ref 135–145)

## 2017-04-27 LAB — I-STAT TROPONIN, ED: Troponin i, poc: 0.01 ng/mL (ref 0.00–0.08)

## 2017-04-27 NOTE — Telephone Encounter (Signed)
Pt c/o chest discomfort,heaviness in her chest for the last 3 days constantly. Care advice given. Pt voiced understanding.  Reason for Disposition . Chest pain lasts > 5 minutes (Exceptions: chest pain occurring > 3 days ago and now asymptomatic; same as previously diagnosed heartburn and has accompanying sour taste in mouth)  Answer Assessment - Initial Assessment Questions 1. LOCATION: "Where does it hurt?"       Mid chest under center of breast bone 2. RADIATION: "Does the pain go anywhere else?" (e.g., into neck, jaw, arms, back)     no 3. ONSET: "When did the chest pain begin?" (Minutes, hours or days)      3 days ago 4. PATTERN "Does the pain come and go, or has it been constant since it started?"  "Does it get worse with exertion?"      Comes and goes but tends longer each day 5. DURATION: "How long does it last" (e.g., seconds, minutes, hours)     hours 6. SEVERITY: "How bad is the pain?"  (e.g., Scale 1-10; mild, moderate, or severe)    - MILD (1-3): doesn't interfere with normal activities     - MODERATE (4-7): interferes with normal activities or awakens from sleep    - SEVERE (8-10): excruciating pain, unable to do any normal activities       3 7. CARDIAC RISK FACTORS: "Do you have any history of heart problems or risk factors for heart disease?" (e.g., prior heart attack, angina; high blood pressure, diabetes, being overweight, high cholesterol, smoking, or strong family history of heart disease)     Smoking, hx of high blood pressure in family, dad hx of heart attack 8. PULMONARY RISK FACTORS: "Do you have any history of lung disease?"  (e.g., blood clots in lung, asthma, emphysema, birth control pills)     asthma 9. CAUSE: "What do you think is causing the chest pain?"     no 10. OTHER SYMPTOMS: "Do you have any other symptoms?" (e.g., dizziness, nausea, vomiting, sweating, fever, difficulty breathing, cough)       no 11. PREGNANCY: "Is there any chance you are pregnant?"  "When was your last menstrual period?"       no  Protocols used: CHEST PAIN-A-AH

## 2017-04-27 NOTE — Discharge Instructions (Signed)
Follow up with your doctor for further evaluation and management of chest discomfort. Return to the emergency room if you have worsening symptoms or new symptoms of concern.

## 2017-04-27 NOTE — ED Notes (Signed)
ED Provider at bedside. 

## 2017-04-27 NOTE — ED Triage Notes (Signed)
Pt states she has had a central chest pain X3 days. Described as heaviness. Pt denies any associated symptoms or cardiac hx. Skin warm and dry, no distress noted.

## 2017-04-27 NOTE — ED Notes (Signed)
Pt departed in NAD, refused use of wheelchair.  

## 2017-04-27 NOTE — ED Provider Notes (Signed)
[facility] Provider Note   CSN: [id number] Arrival date & time: 04/27/17  1441     History   Chief Complaint Chief Complaint  Patient presents with  . Chest Pain    HPI Susan Calderon is a 51 y.o. female.  Patient presents with chest discomfort isolated to central chest that started 3 days ago. It increases and decreases in intensity but is a constant pain she describes as heaviness. No SOB. The heaviness is not affected by breathing. No cough or fever. No nausea, vomiting or diarrhea. She has a history of Rosacea only. No new medications or changes in current medications. There are no alleviating or aggravating factors. She has taken ibuprofen without relief.    The history is provided by the patient. No language interpreter was used.    Past Medical History:  Diagnosis Date  . Allergy   . Rosacea   . Tobacco use disorder     Patient Active Problem List   Diagnosis Date Noted  . Bilateral hip bursitis 06/11/2015  . 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 Rosacea 12/31/2014  . Tobacco use disorder 12/31/2014  . Allergic rhinitis 12/31/2014    Past Surgical History:  Procedure Laterality Date  . BUNIONECTOMY    . CESAREAN SECTION    . TONSILLECTOMY    . TUBAL LIGATION      OB History    No data available       Home Medications    Prior to Admission medications   Medication Sig Start Date End Date Taking? Authorizing Provider  cholecalciferol (VITAMIN D) 1000 UNITS tablet Take 1,000 Units by mouth daily.     Yes [provider]  Cyanocobalamin (VITAMIN B 12 PO) Take 1 tablet by mouth daily.     Yes [provider]  doxycycline (VIBRAMYCIN) 50 MG capsule TAKE 1 CAPSULE BY MOUTH 2 TIMES DAILY. 04/11/17  Yes Gabriel CirriWicker, Cheryl, NP  Fish Oil-Cholecalciferol (FISH OIL + D3) 1000-1000 MG-UNIT CAPS Take by mouth.   Yes [provider]  fluticasone (FLONASE) 50 MCG/ACT nasal spray Place 2 sprays as needed into both  nostrils.    Yes [provider]  ibuprofen (ADVIL,MOTRIN) 200 MG tablet Take 800 mg by mouth every 6 (six) hours as needed.   Yes [provider]  Multiple Vitamin (MULTIVITAMIN) tablet Take 1 tablet by mouth daily.   Yes [provider]  vitamin C (ASCORBIC ACID) 500 MG tablet Take 500 mg by mouth daily.     Yes [provider]  vitamin E 200 UNIT capsule Take 200 Units by mouth daily.   Yes [provider]  amitriptyline (ELAVIL) 10 MG tablet Take 1 tablet (10 mg total) by mouth at bedtime. Patient not taking: Reported on 04/28/2016 10/22/15   Gabriel CirriWicker, Cheryl, NP  traMADol (ULTRAM) 50 MG tablet Take 1 tablet (50 mg total) by mouth at bedtime. Patient not taking: Reported on 08/16/2016 10/22/15   Gabriel CirriWicker, Cheryl, NP    Family History Family History  Problem Relation Age of Onset  . Hypothyroidism Mother   . Heart attack Father   . CAD Father   . Stroke Maternal Grandmother     Social History Social History   Tobacco Use  . Smoking status: Current Every Day Smoker    Packs/day: 0.75    Types: Cigarettes  . Smokeless tobacco: Never Used  Substance Use Topics  . Alcohol use: Yes    Comment: on occasion  . Drug use: No  Allergies   Tree extract   Review of Systems Review of Systems  Constitutional: Negative for chills and fever.  HENT: Negative.   Respiratory: Negative.  Negative for cough and shortness of breath.   Cardiovascular: Positive for chest pain.  Gastrointestinal: Negative.  Negative for abdominal pain and nausea.  Musculoskeletal: Negative.   Skin: Negative.   Neurological: Negative.      Physical Exam Updated Vital Signs BP 109/68   Pulse (!) 50   Temp 97.9 F (36.6 C) (Oral)   Resp 13   LMP 04/05/2017   SpO2 97%   Physical Exam  Constitutional: She is oriented to person, place, and time. She appears well-developed and well-nourished.  HENT:  Head: Normocephalic.  Neck: Normal range of motion. Neck  supple.  Cardiovascular: Normal rate and regular rhythm.  Pulmonary/Chest: Effort normal and breath sounds normal. She has no decreased breath sounds. She has no wheezes. She has no rhonchi. She has no rales.  There is no chest wall tenderness.   Abdominal: Soft. Bowel sounds are normal. There is no tenderness. There is no rebound and no guarding.  Musculoskeletal: Normal range of motion.       Right lower leg: Normal. She exhibits no edema.       Left lower leg: Normal. She exhibits no edema.  Neurological: She is alert and oriented to person, place, and time.  Skin: Skin is warm and dry. No rash noted.  Psychiatric: She has a normal mood and affect.     ED Treatments / Results  Labs (all labs ordered are listed, but only abnormal results are displayed) Labs Reviewed  BASIC METABOLIC PANEL - Abnormal; Notable for the following components:      Result Value   Glucose, Bld 130 (*)    Anion gap 4 (*)    All other components within normal limits  CBC - Abnormal; Notable for the following components:   WBC 3.7 (*)    All other components within normal limits  I-STAT TROPONIN, ED   Results for orders placed or performed during the hospital encounter of 04/27/17  Basic metabolic panel  Result Value Ref Range   Sodium 137 135 - 145 mmol/L   Potassium 4.1 3.5 - 5.1 mmol/L   Chloride 105 101 - 111 mmol/L   CO2 28 22 - 32 mmol/L   Glucose, Bld 130 (H) 65 - 99 mg/dL   BUN 13 6 - 20 mg/dL   Creatinine, Ser 1.61 0.44 - 1.00 mg/dL   Calcium 9.2 8.9 - 09.6 mg/dL   GFR calc non Af Amer >60 >60 mL/min   GFR calc Af Amer >60 >60 mL/min   Anion gap 4 (L) 5 - 15  CBC  Result Value Ref Range   WBC 3.7 (L) 4.0 - 10.5 K/uL   RBC 4.23 3.87 - 5.11 MIL/uL   Hemoglobin 13.6 12.0 - 15.0 g/dL   HCT 04.5 40.9 - 81.1 %   MCV 95.5 78.0 - 100.0 fL   MCH 32.2 26.0 - 34.0 pg   MCHC 33.7 30.0 - 36.0 g/dL   RDW 91.4 78.2 - 95.6 %   Platelets 198 150 - 400 K/uL  I-stat troponin, ED  Result Value Ref  Range   Troponin i, poc 0.01 0.00 - 0.08 ng/mL   Comment 3            EKG  EKG Interpretation None       Radiology Dg Chest 2 View  Result Date: 04/27/2017  CLINICAL DATA:  Midsternal chest pain for 2 days, history of tobacco use EXAM: CHEST  2 VIEW COMPARISON:  01/07/2015 FINDINGS: Cardiac shadow is within normal limits. The lungs are mildly hyperinflated bilaterally. No focal infiltrate or sizable effusion is seen. External lead is noted over the left mid chest. No bony abnormality is noted. IMPRESSION: Mild COPD.  No acute abnormality noted. Electronically Signed   By: Alcide CleverMark  Lukens M.D.   On: 04/27/2017 15:17    Procedures Procedures (including critical care time)  Medications Ordered in ED Medications - No data to display   Initial Impression / Assessment and Plan / ED Course  I have reviewed the triage vital signs and the nursing notes.  Pertinent labs & imaging results that were available during my care of the patient were reviewed by me and considered in my medical decision making (see chart for details).     Patient with a 3-day history of "heaviness" in her chest. No SOB or pleuritic pain.   Labs are normal including a negative troponin. CXR shows only mild COPD.   History and physical do not suggest ACS. Labs support this. She feels she is under stress and this in contributing. It does not follow a pattern of reflux or heartburn.   She is felt appropriate for discharge home with PCP follow up for ongoing evaluation.   Final Clinical Impressions(s) / ED Diagnoses   Final diagnoses:  None   1. Nonspecific chest pain  ED Discharge Orders    None       Danne HarborUpstill, Kaydense Rizo, PA-C 04/27/17 2239    Benjiman CorePickering, Nathan, MD 04/27/17 [phone]

## 2017-05-01 ENCOUNTER — Ambulatory Visit: Payer: 59 | Admitting: Unknown Physician Specialty

## 2017-05-01 ENCOUNTER — Encounter: Payer: Self-pay | Admitting: Unknown Physician Specialty

## 2017-05-01 VITALS — BP 102/65 | HR 71 | Temp 98.4°F | Ht 65.7 in | Wt 131.8 lb

## 2017-05-01 DIAGNOSIS — F419 Anxiety disorder, unspecified: Secondary | ICD-10-CM

## 2017-05-01 DIAGNOSIS — R079 Chest pain, unspecified: Secondary | ICD-10-CM | POA: Diagnosis not present

## 2017-05-01 MED ORDER — CLONAZEPAM 0.5 MG PO TABS: 0.5000 mg | ORAL_TABLET | Freq: Two times a day (BID) | ORAL | 1 refills | Status: DC | PRN

## 2017-05-01 NOTE — Progress Notes (Signed)
BP 102/65   Pulse 71   Temp 98.4 F (36.9 C) (Oral)   Ht 5' 5.7" (1.669 m)   Wt 131 lb 12.8 oz (59.8 kg)   LMP 04/05/2017   SpO2 98%   BMI 21.47 kg/m    Subjective:    Patient ID: Susan Calderon, female    DOB: 07/16/1965, 51 y.o.   MRN: [id number]  HPI: Susan Calderon is a 51 y.o. female  Chief Complaint  Patient presents with  . Follow-up   Chest pain Pt states she is feeling some central chest pain.  She presented to the emergency room on 11/16.  Chest x-ray EKG were normal.  Negative Troponin.  States the pain has never completely gone away and is now about a 1 or 2.  Admits to being under a lot of stress.  She is concerned about her mother in assisted living plus job stress.  States Ibuporofen, Tylenol, or Aleve have not helped.   Nothing seems to help or make it worse but admits when she is at work and working hard, pain gets stronger.  ER note reviewed and determined pain was non-cardiac. Last LDL is 70  [facility] 05/01/2017 08/16/2016 05/14/2015  Decreased Interest 0 0 0  Down, Depressed, Hopeless 0 0 0  PHQ - 2 Score 0 0 0  Altered sleeping 0 - -  Tired, decreased energy 0 - -  Change in appetite 0 - -  Feeling bad or failure about yourself  0 - -  Trouble concentrating 0 - -  Moving slowly or fidgety/restless 0 - -  Suicidal thoughts 0 - -  PHQ-9 Score 0 - -      Relevant past medical, surgical, family and social history reviewed and updated as indicated. Interim medical history since our last visit reviewed. Allergies and medications reviewed and updated.  Review of Systems  Per HPI unless specifically indicated above     Objective:    BP 102/65   Pulse 71   Temp 98.4 F (36.9 C) (Oral)   Ht 5' 5.7" (1.669 m)   Wt 131 lb 12.8 oz (59.8 kg)   LMP 04/05/2017   SpO2 98%   BMI 21.47 kg/m   Wt Readings from Last 3 Encounters:  05/01/17 131 lb 12.8 oz (59.8 kg)  11/17/16 140 lb 12.8 oz (63.9 kg)  08/16/16 138 lb 11.2 oz (62.9 kg)    Physical  Exam  Constitutional: She is oriented to person, place, and time. She appears well-developed and well-nourished. No distress.  HENT:  Head: Normocephalic and atraumatic.  Eyes: Conjunctivae and lids are normal. Right eye exhibits no discharge. Left eye exhibits no discharge. No scleral icterus.  Neck: Normal range of motion. Neck supple. No JVD present. Carotid bruit is not present.  Cardiovascular: Normal rate, regular rhythm and normal heart sounds.  Pulmonary/Chest: Effort normal and breath sounds normal. She exhibits no mass and no tenderness.  Abdominal: Normal appearance. There is no splenomegaly or hepatomegaly.  Musculoskeletal: Normal range of motion.  Neurological: She is alert and oriented to person, place, and time.  Skin: Skin is warm, dry and intact. No rash noted. No pallor.  Psychiatric: She has a normal mood and affect. Her behavior is normal. Judgment and thought content normal.    Results for orders placed or performed during the hospital encounter of 04/27/17  Basic metabolic panel  Result Value Ref Range   Sodium 137 135 - 145 mmol/L   Potassium 4.1 3.5 -  5.1 mmol/L   Chloride 105 101 - 111 mmol/L   CO2 28 22 - 32 mmol/L   Glucose, Bld 130 (H) 65 - 99 mg/dL   BUN 13 6 - 20 mg/dL   Creatinine, Ser 1.610.91 0.44 - 1.00 mg/dL   Calcium 9.2 8.9 - 09.610.3 mg/dL   GFR calc non Af Amer >60 >60 mL/min   GFR calc Af Amer >60 >60 mL/min   Anion gap 4 (L) 5 - 15  CBC  Result Value Ref Range   WBC 3.7 (L) 4.0 - 10.5 K/uL   RBC 4.23 3.87 - 5.11 MIL/uL   Hemoglobin 13.6 12.0 - 15.0 g/dL   HCT 04.540.4 40.936.0 - 81.146.0 %   MCV 95.5 78.0 - 100.0 fL   MCH 32.2 26.0 - 34.0 pg   MCHC 33.7 30.0 - 36.0 g/dL   RDW 91.412.2 78.211.5 - 95.615.5 %   Platelets 198 150 - 400 K/uL  I-stat troponin, ED  Result Value Ref Range   Troponin i, poc 0.01 0.00 - 0.08 ng/mL   Comment 3              Assessment & Plan:   Problem List Items Addressed This Visit    None    Visit Diagnoses    Chest pain,  unspecified type    -  Primary   Pt with what sounds like non-cardiac pain but worse with working hard.  Will refer to cardiology for further evaluation.  Start 1 ASA/day   Relevant Orders   Ambulatory referral to Cardiology   Anxiety       Treat with limited dose of Clonazepam .5 mg to take prn to see if it helps.  Limited amount of #20       Follow up plan: Return in about 2 months (around 07/01/2017) for hip injection . and to assess anxiety

## 2017-05-24 ENCOUNTER — Other Ambulatory Visit: Payer: Self-pay | Admitting: Unknown Physician Specialty

## 2017-07-03 ENCOUNTER — Other Ambulatory Visit: Payer: Self-pay | Admitting: Unknown Physician Specialty

## 2017-07-06 ENCOUNTER — Ambulatory Visit: Payer: Self-pay | Admitting: Unknown Physician Specialty

## 2017-07-09 ENCOUNTER — Other Ambulatory Visit: Payer: Self-pay | Admitting: Unknown Physician Specialty

## 2017-07-09 DIAGNOSIS — Z1231 Encounter for screening mammogram for malignant neoplasm of breast: Secondary | ICD-10-CM

## 2017-08-06 ENCOUNTER — Other Ambulatory Visit: Payer: Self-pay | Admitting: Family Medicine

## 2017-08-06 NOTE — Telephone Encounter (Signed)
Your patient 

## 2017-08-22 ENCOUNTER — Ambulatory Visit
Admission: RE | Admit: 2017-08-22 | Discharge: 2017-08-22 | Disposition: A | Discharge status: Home / Self Care | Payer: Managed Care, Other (non HMO) | Source: Ambulatory Visit | Attending: Unknown Physician Specialty | Admitting: Unknown Physician Specialty

## 2017-08-22 ENCOUNTER — Encounter: Payer: Self-pay | Admitting: Unknown Physician Specialty

## 2017-08-22 DIAGNOSIS — Z1231 Encounter for screening mammogram for malignant neoplasm of breast: Secondary | ICD-10-CM | POA: Insufficient documentation

## 2017-08-23 ENCOUNTER — Other Ambulatory Visit: Payer: Self-pay | Admitting: Unknown Physician Specialty

## 2017-08-23 DIAGNOSIS — N6310 Unspecified lump in the right breast, unspecified quadrant: Secondary | ICD-10-CM

## 2017-08-23 DIAGNOSIS — R928 Other abnormal and inconclusive findings on diagnostic imaging of breast: Secondary | ICD-10-CM

## 2017-09-12 ENCOUNTER — Ambulatory Visit
Admission: RE | Admit: 2017-09-12 | Discharge: 2017-09-12 | Disposition: A | Discharge status: Home / Self Care | Payer: Managed Care, Other (non HMO) | Source: Ambulatory Visit | Attending: Unknown Physician Specialty | Admitting: Unknown Physician Specialty

## 2017-09-12 DIAGNOSIS — R928 Other abnormal and inconclusive findings on diagnostic imaging of breast: Secondary | ICD-10-CM

## 2017-09-12 DIAGNOSIS — N6001 Solitary cyst of right breast: Secondary | ICD-10-CM | POA: Diagnosis not present

## 2017-09-12 DIAGNOSIS — N6310 Unspecified lump in the right breast, unspecified quadrant: Secondary | ICD-10-CM

## 2017-09-13 ENCOUNTER — Other Ambulatory Visit: Payer: Self-pay | Admitting: Unknown Physician Specialty

## 2017-09-13 NOTE — Telephone Encounter (Signed)
Rx  Request  Doxycycline  LOV 05/01/2017  Pharmacy on file

## 2017-09-18 ENCOUNTER — Ambulatory Visit (INDEPENDENT_AMBULATORY_CARE_PROVIDER_SITE_OTHER): Payer: Managed Care, Other (non HMO) | Admitting: Unknown Physician Specialty

## 2017-09-18 ENCOUNTER — Encounter: Payer: Self-pay | Admitting: Unknown Physician Specialty

## 2017-09-18 VITALS — BP 115/78 | HR 81 | Temp 98.0°F | Ht 65.1 in | Wt 137.6 lb

## 2017-09-18 DIAGNOSIS — M778 Other enthesopathies, not elsewhere classified: Secondary | ICD-10-CM | POA: Insufficient documentation

## 2017-09-18 DIAGNOSIS — Z0000 Encounter for general adult medical examination without abnormal findings: Secondary | ICD-10-CM

## 2017-09-18 DIAGNOSIS — F17200 Nicotine dependence, unspecified, uncomplicated: Secondary | ICD-10-CM | POA: Diagnosis not present

## 2017-09-18 DIAGNOSIS — M7582 Other shoulder lesions, left shoulder: Secondary | ICD-10-CM | POA: Diagnosis not present

## 2017-09-18 DIAGNOSIS — M7581 Other shoulder lesions, right shoulder: Secondary | ICD-10-CM

## 2017-09-18 DIAGNOSIS — F419 Anxiety disorder, unspecified: Secondary | ICD-10-CM | POA: Insufficient documentation

## 2017-09-18 DIAGNOSIS — Z780 Asymptomatic menopausal state: Secondary | ICD-10-CM | POA: Diagnosis not present

## 2017-09-18 DIAGNOSIS — L719 Rosacea, unspecified: Secondary | ICD-10-CM

## 2017-09-18 DIAGNOSIS — Z1211 Encounter for screening for malignant neoplasm of colon: Secondary | ICD-10-CM

## 2017-09-18 MED ORDER — CITALOPRAM HYDROBROMIDE 10 MG PO TABS: 10.0000 mg | ORAL_TABLET | Freq: Every day | ORAL | 1 refills | Status: DC

## 2017-09-18 NOTE — Assessment & Plan Note (Signed)
Discussed with pt options of hormones, low dose anti-depressants, and OTC preparations.  Due to corresponding anxiety, start Low dose SSRI

## 2017-09-18 NOTE — Progress Notes (Signed)
BP 115/78   Pulse 81   Temp 98 F (36.7 C) (Oral)   Ht 5' 5.1" (1.654 m)   Wt 137 lb 9.6 oz (62.4 kg)   LMP 09/12/2017   SpO2 98%   BMI 22.83 kg/m    Subjective:    Patient ID: Susan Calderon, female    DOB: 04-Jan-1966, 52 y.o.   MRN: [id number]  HPI: Susan Calderon is a 52 y.o. female  Chief Complaint  Patient presents with  . Annual Exam    pt states she is interested in a colonoscopy   Menopause Pt states she feels she is entering into menopause.  LMP was the middle of January.  She states she is having hot flashes worse in the evening and problematic at night.  Not bothering her during the day.  Wakes up 2-3 times at night with hot flashes.    Hip pain Bilateral hip bursitis.  Seems to be improved.     Shoulder Pain   The pain is present in the right shoulder and left shoulder. This is a new problem. Episode onset: 8 months. There has been no history of extremity trauma. The problem occurs constantly. The problem has been unchanged. The quality of the pain is described as aching. Exacerbated by: worse sleeping and waking up. She has tried NSAIDS for the symptoms. The treatment provided moderate relief.   Rosacea Takes Doxycycline daily  Anxiety Takes Clonazepam twice a month when anxious and gets pain in her chest Depression screen [facility] 2/9 09/18/2017 05/01/2017 08/16/2016 05/14/2015  Decreased Interest 0 0 0 0  Down, Depressed, Hopeless 0 0 0 0  PHQ - 2 Score 0 0 0 0  Altered sleeping 3 0 - -  Tired, decreased energy 2 0 - -  Change in appetite 1 0 - -  Feeling bad or failure about yourself  0 0 - -  Trouble concentrating 0 0 - -  Moving slowly or fidgety/restless 0 0 - -  Suicidal thoughts 0 0 - -  PHQ-9 Score 6 0 - -     Tobacco Smoke 1ppd.  Not ready to quit  Social History   Socioeconomic History  . Marital status: Divorced    Spouse name: Not on file  . Number of children: Not on file  . Years of education: Not on file  . Highest education level: Not on file    Occupational History  . Not on file  Social Needs  . Financial resource strain: Not on file  . Food insecurity:    Worry: Not on file    Inability: Not on file  . Transportation needs:    Medical: Not on file    Non-medical: Not on file  Tobacco Use  . Smoking status: Current Every Day Smoker    Packs/day: 0.75    Types: Cigarettes  . Smokeless tobacco: Never Used  Substance and Sexual Activity  . Alcohol use: Yes    Comment: on occasion  . Drug use: No  . Sexual activity: Never  Lifestyle  . Physical activity:    Days per week: Not on file    Minutes per session: Not on file  . Stress: Not on file  Relationships  . Social connections:    Talks on phone: Not on file    Gets together: Not on file    Attends religious service: Not on file    Active member of club or organization: Not on file    Attends meetings of  clubs or organizations: Not on file    Relationship status: Not on file  . Intimate partner violence:    Fear of current or ex partner: Not on file    Emotionally abused: Not on file    Physically abused: Not on file    Forced sexual activity: Not on file  Other Topics Concern  . Not on file  Social History Narrative  . Not on file   Family History  Problem Relation Age of Onset  . Hypothyroidism Mother   . Heart attack Father   . CAD Father   . Stroke Maternal Grandmother   . Breast cancer Neg Hx    Past Medical History:  Diagnosis Date  . Allergy   . Rosacea   . Tobacco use disorder    Past Surgical History:  Procedure Laterality Date  . BUNIONECTOMY    . CESAREAN SECTION    . TONSILLECTOMY    . TUBAL LIGATION       Relevant past medical, surgical, family and social history reviewed and updated as indicated. Interim medical history since our last visit reviewed. Allergies and medications reviewed and updated.  Review of Systems  Constitutional: Negative.   HENT: Negative.   Eyes: Negative.   Respiratory: Negative.   Cardiovascular:  Negative.   Gastrointestinal:       Eating more lately and having weight gain  Endocrine: Negative.   Genitourinary: Negative.   Skin: Negative.   Allergic/Immunologic: Negative.   Neurological: Negative.   Hematological: Negative.   Psychiatric/Behavioral: Negative.     Per HPI unless specifically indicated above     Objective:    BP 115/78   Pulse 81   Temp 98 F (36.7 C) (Oral)   Ht 5' 5.1" (1.654 m)   Wt 137 lb 9.6 oz (62.4 kg)   LMP 09/12/2017   SpO2 98%   BMI 22.83 kg/m   Wt Readings from Last 3 Encounters:  09/18/17 137 lb 9.6 oz (62.4 kg)  05/01/17 131 lb 12.8 oz (59.8 kg)  11/17/16 140 lb 12.8 oz (63.9 kg)    Physical Exam  Constitutional: She is oriented to person, place, and time. She appears well-developed and well-nourished.  HENT:  Head: Normocephalic and atraumatic.  Eyes: Pupils are equal, round, and reactive to light. Right eye exhibits no discharge. Left eye exhibits no discharge. No scleral icterus.  Neck: Normal range of motion. Neck supple. Carotid bruit is not present. No thyromegaly present.  Cardiovascular: Normal rate, regular rhythm and normal heart sounds. Exam reveals no gallop and no friction rub.  No murmur heard. Pulmonary/Chest: Effort normal and breath sounds normal. No respiratory distress. She has no wheezes. She has no rales. No breast swelling, tenderness or discharge.  Abdominal: Soft. Bowel sounds are normal. There is no tenderness. There is no rebound.  Genitourinary: No breast swelling, tenderness or discharge.  Musculoskeletal: Normal range of motion.  Lymphadenopathy:    She has no cervical adenopathy.  Neurological: She is alert and oriented to person, place, and time.  Skin: Skin is warm, dry and intact. No rash noted.  Psychiatric: She has a normal mood and affect. Her speech is normal and behavior is normal. Judgment and thought content normal. Cognition and memory are normal.    Results for orders placed or performed  during the hospital encounter of 04/27/17  Basic metabolic panel  Result Value Ref Range   Sodium 137 135 - 145 mmol/L   Potassium 4.1 3.5 - 5.1 mmol/L  Chloride 105 101 - 111 mmol/L   CO2 28 22 - 32 mmol/L   Glucose, Bld 130 (H) 65 - 99 mg/dL   BUN 13 6 - 20 mg/dL   Creatinine, Ser 1.61 0.44 - 1.00 mg/dL   Calcium 9.2 8.9 - 09.6 mg/dL   GFR calc non Af Amer >60 >60 mL/min   GFR calc Af Amer >60 >60 mL/min   Anion gap 4 (L) 5 - 15  CBC  Result Value Ref Range   WBC 3.7 (L) 4.0 - 10.5 K/uL   RBC 4.23 3.87 - 5.11 MIL/uL   Hemoglobin 13.6 12.0 - 15.0 g/dL   HCT 04.5 40.9 - 81.1 %   MCV 95.5 78.0 - 100.0 fL   MCH 32.2 26.0 - 34.0 pg   MCHC 33.7 30.0 - 36.0 g/dL   RDW 91.4 78.2 - 95.6 %   Platelets 198 150 - 400 K/uL  I-stat troponin, ED  Result Value Ref Range   Troponin i, poc 0.01 0.00 - 0.08 ng/mL   Comment 3              Assessment & Plan:   Problem List Items Addressed This Visit      Unprioritized   Anxiety    Takes Clonazepam twice a month when she gets chest pain related to anxiety      Relevant Medications   citalopram (CELEXA) 10 MG tablet   Menopause    Discussed with pt options of hormones, low dose anti-depressants, and OTC preparations.  Due to corresponding anxiety, start Low dose SSRI      Rosacea    Stable.  Continue present treatment      Tendonitis of both shoulders    Exercises given.  Pt ed      Tobacco use disorder    Encouraged to quit smoking       Other Visit Diagnoses    Colon cancer screening    -  Primary   Relevant Orders   Ambulatory referral to Gastroenterology   Annual physical exam       Relevant Orders   CBC with Differential/Platelet   Comprehensive metabolic panel   Lipid Panel w/o Chol/HDL Ratio   TSH      HM Just had mammogram last month Pap due 2020 TD due 2027  Follow up plan: Return in about 1 month (around 10/16/2017).

## 2017-09-18 NOTE — Assessment & Plan Note (Signed)
Encouraged to quit smoking.  

## 2017-09-18 NOTE — Assessment & Plan Note (Signed)
Takes Clonazepam twice a month when she gets chest pain related to anxiety

## 2017-09-18 NOTE — Assessment & Plan Note (Signed)
Stable.  Continue presenttreatment 

## 2017-09-18 NOTE — Patient Instructions (Signed)
Menopause Menopause is the normal time of life when menstrual periods stop completely. Menopause is complete when you have missed 12 consecutive menstrual periods. It usually occurs between the ages of 48 years and 55 years. Very rarely does a woman develop menopause before the age of 40 years. At menopause, your ovaries stop producing the female hormones estrogen and progesterone. This can cause undesirable symptoms and also affect your health. Sometimes the symptoms may occur 4-5 years before the menopause begins. There is no relationship between menopause and:  Oral contraceptives.  Number of children you had.  Race.  The age your menstrual periods started (menarche).  Heavy smokers and very thin women may develop menopause earlier in life. What are the causes?  The ovaries stop producing the female hormones estrogen and progesterone. Other causes include:  Surgery to remove both ovaries.  The ovaries stop functioning for no known reason.  Tumors of the pituitary gland in the brain.  Medical disease that affects the ovaries and hormone production.  Radiation treatment to the abdomen or pelvis.  Chemotherapy that affects the ovaries.  What are the signs or symptoms?  Hot flashes.  Night sweats.  Decrease in sex drive.  Vaginal dryness and thinning of the vagina causing painful intercourse.  Dryness of the skin and developing wrinkles.  Headaches.  Tiredness.  Irritability.  Memory problems.  Weight gain.  Bladder infections.  Hair growth of the face and chest.  Infertility. More serious symptoms include:  Loss of bone (osteoporosis) causing breaks (fractures).  Depression.  Hardening and narrowing of the arteries (atherosclerosis) causing heart attacks and strokes.  How is this diagnosed?  When the menstrual periods have stopped for 12 straight months.  Physical exam.  Hormone studies of the blood. How is this treated? There are many treatment  choices and nearly as many questions about them. The decisions to treat or not to treat menopausal changes is an individual choice made with your health care provider. Your health care provider can discuss the treatments with you. Together, you can decide which treatment will work best for you. Your treatment choices may include:  Hormone therapy (estrogen and progesterone).  Non-hormonal medicines.  Treating the individual symptoms with medicine (for example antidepressants for depression).  Herbal medicines that may help specific symptoms.  Counseling by a psychiatrist or psychologist.  Group therapy.  Lifestyle changes including: ? Eating healthy. ? Regular exercise. ? Limiting caffeine and alcohol. ? Stress management and meditation.  No treatment.  Follow these instructions at home:  Take the medicine your health care provider gives you as directed.  Get plenty of sleep and rest.  Exercise regularly.  Eat a diet that contains calcium (good for the bones) and soy products (acts like estrogen hormone).  Avoid alcoholic beverages.  Do not smoke.  If you have hot flashes, dress in layers.  Take supplements, calcium, and vitamin D to strengthen bones.  You can use over-the-counter lubricants or moisturizers for vaginal dryness.  Group therapy is sometimes very helpful.  Acupuncture may be helpful in some cases. Contact a health care provider if:  You are not sure you are in menopause.  You are having menopausal symptoms and need advice and treatment.  You are still having menstrual periods after age 55 years.  You have pain with intercourse.  Menopause is complete (no menstrual period for 12 months) and you develop vaginal bleeding.  You need a referral to a specialist (gynecologist, psychiatrist, or psychologist) for treatment. Get help right   away if:  You have severe depression.  You have excessive vaginal bleeding.  You fell and think you have a  broken bone.  You have pain when you urinate.  You develop leg or chest pain.  You have a fast pounding heart beat (palpitations).  You have severe headaches.  You develop vision problems.  You feel a lump in your breast.  You have abdominal pain or severe indigestion. This information is not intended to replace advice given to you by your health care provider. Make sure you discuss any questions you have with your health care provider. Document Released: 08/19/2003 Document Revised: 11/04/2015 Document Reviewed: 12/26/2012 Elsevier Interactive Patient Education  2017 [employer]  Shoulder Exercises Ask your health care provider which exercises are safe for you. Do exercises exactly as told by your health care provider and adjust them as directed. It is normal to feel mild stretching, pulling, tightness, or discomfort as you do these exercises, but you should stop right away if you feel sudden pain or your pain gets worse.Do not begin these exercises until told by your health care provider. RANGE OF MOTION EXERCISES These exercises warm up your muscles and joints and improve the movement and flexibility of your shoulder. These exercises also help to relieve pain, numbness, and tingling. These exercises involve stretching your injured shoulder directly. Exercise A: Pendulum  1. Stand near a wall or a surface that you can hold onto for balance. 2. Bend at the waist and let your left / right arm hang straight down. Use your other arm to support you. Keep your back straight and do not lock your knees. 3. Relax your left / right arm and shoulder muscles, and move your hips and your trunk so your left / right arm swings freely. Your arm should swing because of the motion of your body, not because you are using your arm or shoulder muscles. 4. Keep moving your body so your arm swings in the following directions, as told by your health care provider: ? Side to side. ? Forward and  backward. ? In clockwise and counterclockwise circles. 5. Continue each motion for __________ seconds, or for as long as told by your health care provider. 6. Slowly return to the starting position. Repeat __________ times. Complete this exercise __________ times a day. Exercise B:Flexion, Standing  1. Stand and hold a broomstick, a cane, or a similar object. Place your hands a little more than shoulder-width apart on the object. Your left / right hand should be palm-up, and your other hand should be palm-down. 2. Keep your elbow straight and keep your shoulder muscles relaxed. Push the stick down with your healthy arm to raise your left / right arm in front of your body, and then over your head until you feel a stretch in your shoulder. ? Avoid shrugging your shoulder while you raise your arm. Keep your shoulder blade tucked down toward the middle of your back. 3. Hold for __________ seconds. 4. Slowly return to the starting position. Repeat __________ times. Complete this exercise __________ times a day. Exercise C: Abduction, Standing 1. Stand and hold a broomstick, a cane, or a similar object. Place your hands a little more than shoulder-width apart on the object. Your left / right hand should be palm-up, and your other hand should be palm-down. 2. While keeping your elbow straight and your shoulder muscles relaxed, push the stick across your body toward your left / right side. Raise your left / right arm  to the side of your body and then over your head until you feel a stretch in your shoulder. ? Do not raise your arm above shoulder height, unless your health care provider tells you to do that. ? Avoid shrugging your shoulder while you raise your arm. Keep your shoulder blade tucked down toward the middle of your back. 3. Hold for __________ seconds. 4. Slowly return to the starting position. Repeat __________ times. Complete this exercise __________ times a day. Exercise D:Internal  Rotation  1. Place your left / right hand behind your back, palm-up. 2. Use your other hand to dangle an exercise band, a towel, or a similar object over your shoulder. Grasp the band with your left / right hand so you are holding onto both ends. 3. Gently pull up on the band until you feel a stretch in the front of your left / right shoulder. ? Avoid shrugging your shoulder while you raise your arm. Keep your shoulder blade tucked down toward the middle of your back. 4. Hold for __________ seconds. 5. Release the stretch by letting go of the band and lowering your hands. Repeat __________ times. Complete this exercise __________ times a day. STRETCHING EXERCISES These exercises warm up your muscles and joints and improve the movement and flexibility of your shoulder. These exercises also help to relieve pain, numbness, and tingling. These exercises are done using your healthy shoulder to help stretch the muscles of your injured shoulder. Exercise E: Warehouse manager (External Rotation and Abduction)  1. Stand in a doorway with one of your feet slightly in front of the other. This is called a staggered stance. If you cannot reach your forearms to the door frame, stand facing a corner of a room. 2. Choose one of the following positions as told by your health care provider: ? Place your hands and forearms on the door frame above your head. ? Place your hands and forearms on the door frame at the height of your head. ? Place your hands on the door frame at the height of your elbows. 3. Slowly move your weight onto your front foot until you feel a stretch across your chest and in the front of your shoulders. Keep your head and chest upright and keep your abdominal muscles tight. 4. Hold for __________ seconds. 5. To release the stretch, shift your weight to your back foot. Repeat __________ times. Complete this stretch __________ times a day. Exercise F:Extension, Standing 1. Stand and hold a  broomstick, a cane, or a similar object behind your back. ? Your hands should be a little wider than shoulder-width apart. ? Your palms should face away from your back. 2. Keeping your elbows straight and keeping your shoulder muscles relaxed, move the stick away from your body until you feel a stretch in your shoulder. ? Avoid shrugging your shoulders while you move the stick. Keep your shoulder blade tucked down toward the middle of your back. 3. Hold for __________ seconds. 4. Slowly return to the starting position. Repeat __________ times. Complete this exercise __________ times a day. STRENGTHENING EXERCISES These exercises build strength and endurance in your shoulder. Endurance is the ability to use your muscles for a long time, even after they get tired. Exercise G:External Rotation  1. Sit in a stable chair without armrests. 2. Secure an exercise band at elbow height on your left / right side. 3. Place a soft object, such as a folded towel or a small pillow, between your left /  right upper arm and your body to move your elbow a few inches away (about 10 cm) from your side. 4. Hold the end of the band so it is tight and there is no slack. 5. Keeping your elbow pressed against the soft object, move your left / right forearm out, away from your abdomen. Keep your body steady so only your forearm moves. 6. Hold for __________ seconds. 7. Slowly return to the starting position. Repeat __________ times. Complete this exercise __________ times a day. Exercise H:Shoulder Abduction  1. Sit in a stable chair without armrests, or stand. 2. Hold a __________ weight in your left / right hand, or hold an exercise band with both hands. 3. Start with your arms straight down and your left / right palm facing in, toward your body. 4. Slowly lift your left / right hand out to your side. Do not lift your hand above shoulder height unless your health care provider tells you that this is safe. ? Keep  your arms straight. ? Avoid shrugging your shoulder while you do this movement. Keep your shoulder blade tucked down toward the middle of your back. 5. Hold for __________ seconds. 6. Slowly lower your arm, and return to the starting position. Repeat __________ times. Complete this exercise __________ times a day. Exercise I:Shoulder Extension 1. Sit in a stable chair without armrests, or stand. 2. Secure an exercise band to a stable object in front of you where it is at shoulder height. 3. Hold one end of the exercise band in each hand. Your palms should face each other. 4. Straighten your elbows and lift your hands up to shoulder height. 5. Step back, away from the secured end of the exercise band, until the band is tight and there is no slack. 6. Squeeze your shoulder blades together as you pull your hands down to the sides of your thighs. Stop when your hands are straight down by your sides. Do not let your hands go behind your body. 7. Hold for __________ seconds. 8. Slowly return to the starting position. Repeat __________ times. Complete this exercise __________ times a day. Exercise J:Standing Shoulder Row 1. Sit in a stable chair without armrests, or stand. 2. Secure an exercise band to a stable object in front of you so it is at waist height. 3. Hold one end of the exercise band in each hand. Your palms should be in a thumbs-up position. 4. Bend each of your elbows to an "L" shape (about 90 degrees) and keep your upper arms at your sides. 5. Step back until the band is tight and there is no slack. 6. Slowly pull your elbows back behind you. 7. Hold for __________ seconds. 8. Slowly return to the starting position. Repeat __________ times. Complete this exercise __________ times a day. Exercise K:Shoulder Press-Ups  1. Sit in a stable chair that has armrests. Sit upright, with your feet flat on the floor. 2. Put your hands on the armrests so your elbows are bent and your  fingers are pointing forward. Your hands should be about even with the sides of your body. 3. Push down on the armrests and use your arms to lift yourself off of the chair. Straighten your elbows and lift yourself up as much as you comfortably can. ? Move your shoulder blades down, and avoid letting your shoulders move up toward your ears. ? Keep your feet on the ground. As you get stronger, your feet should support less of your body weight  as you lift yourself up. 4. Hold for __________ seconds. 5. Slowly lower yourself back into the chair. Repeat __________ times. Complete this exercise __________ times a day. Exercise L: Wall Push-Ups  1. Stand so you are facing a stable wall. Your feet should be about one arm-length away from the wall. 2. Lean forward and place your palms on the wall at shoulder height. 3. Keep your feet flat on the floor as you bend your elbows and lean forward toward the wall. 4. Hold for __________ seconds. 5. Straighten your elbows to push yourself back to the starting position. Repeat __________ times. Complete this exercise __________ times a day. This information is not intended to replace advice given to you by your health care provider. Make sure you discuss any questions you have with your health care provider. Document Released: 04/12/2005 Document Revised: 02/21/2016 Document Reviewed: 02/07/2015 Elsevier Interactive Patient Education  2018 [employer].

## 2017-09-18 NOTE — Assessment & Plan Note (Signed)
Exercises given.  Pt ed

## 2017-09-19 LAB — CBC WITH DIFFERENTIAL/PLATELET
BASOS ABS: 0 10*3/uL (ref 0.0–0.2)
Basos: 0 %
EOS (ABSOLUTE): 0.1 10*3/uL (ref 0.0–0.4)
Eos: 3 %
Hematocrit: 37 % (ref 34.0–46.6)
Hemoglobin: 12.3 g/dL (ref 11.1–15.9)
IMMATURE GRANS (ABS): 0 10*3/uL (ref 0.0–0.1)
IMMATURE GRANULOCYTES: 0 %
LYMPHS: 31 %
Lymphocytes Absolute: 1.5 10*3/uL (ref 0.7–3.1)
MCH: 31.6 pg (ref 26.6–33.0)
MCHC: 33.2 g/dL (ref 31.5–35.7)
MCV: 95 fL (ref 79–97)
Monocytes Absolute: 0.4 10*3/uL (ref 0.1–0.9)
Monocytes: 8 %
NEUTROS PCT: 58 %
Neutrophils Absolute: 2.7 10*3/uL (ref 1.4–7.0)
PLATELETS: 218 10*3/uL (ref 150–379)
RBC: 3.89 x10E6/uL (ref 3.77–5.28)
RDW: 13.2 % (ref 12.3–15.4)
WBC: 4.7 10*3/uL (ref 3.4–10.8)

## 2017-09-19 LAB — TSH: TSH: 2.14 u[IU]/mL (ref 0.450–4.500)

## 2017-09-19 LAB — COMPREHENSIVE METABOLIC PANEL
ALT: 19 IU/L (ref 0–32)
AST: 20 IU/L (ref 0–40)
Albumin/Globulin Ratio: 2.9 — ABNORMAL HIGH (ref 1.2–2.2)
Albumin: 4.6 g/dL (ref 3.5–5.5)
Alkaline Phosphatase: 64 IU/L (ref 39–117)
BILIRUBIN TOTAL: 0.2 mg/dL (ref 0.0–1.2)
BUN/Creatinine Ratio: 16 (ref 9–23)
BUN: 12 mg/dL (ref 6–24)
CALCIUM: 9.2 mg/dL (ref 8.7–10.2)
CHLORIDE: 103 mmol/L (ref 96–106)
CO2: 25 mmol/L (ref 20–29)
Creatinine, Ser: 0.76 mg/dL (ref 0.57–1.00)
GFR, EST AFRICAN AMERICAN: 105 mL/min/{1.73_m2} (ref >59)
GFR, EST NON AFRICAN AMERICAN: 91 mL/min/{1.73_m2} (ref >59)
Globulin, Total: 1.6 g/dL (ref 1.5–4.5)
Glucose: 82 mg/dL (ref 65–99)
Potassium: 3.6 mmol/L (ref 3.5–5.2)
Sodium: 143 mmol/L (ref 134–144)
TOTAL PROTEIN: 6.2 g/dL (ref 6.0–8.5)

## 2017-09-19 LAB — LIPID PANEL W/O CHOL/HDL RATIO
Cholesterol, Total: 147 mg/dL (ref 100–199)
HDL: 67 mg/dL (ref >39)
LDL Calculated: 60 mg/dL (ref 0–99)
TRIGLYCERIDES: 100 mg/dL (ref 0–149)
VLDL CHOLESTEROL CAL: 20 mg/dL (ref 5–40)

## 2017-09-21 ENCOUNTER — Encounter: Payer: Self-pay | Admitting: Unknown Physician Specialty

## 2017-10-04 ENCOUNTER — Other Ambulatory Visit: Payer: Self-pay

## 2017-10-04 ENCOUNTER — Encounter: Payer: Self-pay | Admitting: Gastroenterology

## 2017-10-04 DIAGNOSIS — Z1211 Encounter for screening for malignant neoplasm of colon: Secondary | ICD-10-CM

## 2017-10-11 ENCOUNTER — Telehealth: Payer: Self-pay | Admitting: Gastroenterology

## 2017-10-11 NOTE — Telephone Encounter (Signed)
Pt left vm she states she has procedure 05/6 and has some questions she would like a call

## 2017-10-12 NOTE — Telephone Encounter (Signed)
Returned patient's call regarding questions prior to procedure.   No answer. Left voicemail.   - Pt left vm she states she has procedure 05/6 and has some questions she would like a call

## 2017-10-15 ENCOUNTER — Encounter
Admission: RE | Disposition: A | Discharge status: Home / Self Care | Payer: Self-pay | Source: Ambulatory Visit | Attending: Gastroenterology

## 2017-10-15 ENCOUNTER — Encounter: Payer: Self-pay | Admitting: Anesthesiology

## 2017-10-15 ENCOUNTER — Ambulatory Visit
Admission: RE | Admit: 2017-10-15 | Discharge: 2017-10-15 | Disposition: A | Discharge status: Home / Self Care | Payer: Managed Care, Other (non HMO) | Source: Ambulatory Visit | Attending: Gastroenterology | Admitting: Gastroenterology

## 2017-10-15 ENCOUNTER — Ambulatory Visit: Payer: Managed Care, Other (non HMO) | Admitting: Anesthesiology

## 2017-10-15 DIAGNOSIS — Z1211 Encounter for screening for malignant neoplasm of colon: Secondary | ICD-10-CM | POA: Diagnosis not present

## 2017-10-15 DIAGNOSIS — F17210 Nicotine dependence, cigarettes, uncomplicated: Secondary | ICD-10-CM | POA: Insufficient documentation

## 2017-10-15 HISTORY — PX: COLONOSCOPY WITH PROPOFOL: SHX5780

## 2017-10-15 LAB — POCT PREGNANCY, URINE: PREG TEST UR: NEGATIVE

## 2017-10-15 SURGERY — COLONOSCOPY WITH PROPOFOL
Anesthesia: General

## 2017-10-15 MED ORDER — PROPOFOL 10 MG/ML IV BOLUS
INTRAVENOUS | Status: DC | PRN
  Administered 2017-10-15: 50 mg via INTRAVENOUS

## 2017-10-15 MED ORDER — LIDOCAINE HCL (CARDIAC) PF 100 MG/5ML IV SOSY
PREFILLED_SYRINGE | INTRAVENOUS | Status: DC | PRN
  Administered 2017-10-15: 50 mg via INTRAVENOUS

## 2017-10-15 MED ORDER — SODIUM CHLORIDE 0.9 % IV SOLN
INTRAVENOUS | Status: DC
  Administered 2017-10-15: 11:00:00 via INTRAVENOUS

## 2017-10-15 MED ORDER — FENTANYL CITRATE (PF) 100 MCG/2ML IJ SOLN
INTRAMUSCULAR | Status: AC
  Filled 2017-10-15: qty 2

## 2017-10-15 MED ORDER — MIDAZOLAM HCL 2 MG/2ML IJ SOLN
INTRAMUSCULAR | Status: AC
  Filled 2017-10-15: qty 2

## 2017-10-15 MED ORDER — PROPOFOL 500 MG/50ML IV EMUL
INTRAVENOUS | Status: DC | PRN
  Administered 2017-10-15: 120 ug/kg/min via INTRAVENOUS

## 2017-10-15 MED ORDER — PROPOFOL 500 MG/50ML IV EMUL
INTRAVENOUS | Status: AC
  Filled 2017-10-15: qty 50

## 2017-10-15 MED ORDER — PHENYLEPHRINE HCL 10 MG/ML IJ SOLN
INTRAMUSCULAR | Status: DC | PRN
  Administered 2017-10-15: 100 ug via INTRAVENOUS

## 2017-10-15 NOTE — Anesthesia Post-op Follow-up Note (Signed)
Anesthesia QCDR form completed.        

## 2017-10-15 NOTE — Anesthesia Preprocedure Evaluation (Signed)
Anesthesia Evaluation  Patient identified by MRN, date of birth, ID band Patient awake    Reviewed: Allergy & Precautions, H&P , NPO status , Patient's Chart, lab work & pertinent test results  History of Anesthesia Complications Negative for: history of anesthetic complications  Airway Mallampati: III  TM Distance: <3 FB Neck ROM: limited    Dental  (+) Chipped, Poor Dentition, Caps   Pulmonary neg shortness of breath, COPD, Current Smoker,           Cardiovascular Exercise Tolerance: Good (-) angina(-) Past MI and (-) DOE negative cardio ROS       Neuro/Psych PSYCHIATRIC DISORDERS Anxiety negative neurological ROS     GI/Hepatic negative GI ROS, Neg liver ROS,   Endo/Other  negative endocrine ROS  Renal/GU negative Renal ROS  negative genitourinary   Musculoskeletal   Abdominal   Peds  Hematology negative hematology ROS (+)   Anesthesia Other Findings Past Medical History: No date: Allergy No date: Rosacea No date: Tobacco use disorder  Past Surgical History: No date: BUNIONECTOMY No date: CESAREAN SECTION No date: TONSILLECTOMY No date: TUBAL LIGATION  BMI    Body Mass Index:  21.97 kg/m      Reproductive/Obstetrics negative OB ROS                             Anesthesia Physical Anesthesia Plan  ASA: III  Anesthesia Plan: General   Post-op Pain Management:    Induction: Intravenous  PONV Risk Score and Plan: Propofol infusion and TIVA  Airway Management Planned: Natural Airway and Nasal Cannula  Additional Equipment:   Intra-op Plan:   Post-operative Plan:   Informed Consent: I have reviewed the patients History and Physical, chart, labs and discussed the procedure including the risks, benefits and alternatives for the proposed anesthesia with the patient or authorized representative who has indicated his/her understanding and acceptance.   Dental Advisory  Given  Plan Discussed with: Anesthesiologist, CRNA and Surgeon  Anesthesia Plan Comments: (Patient consented for risks of anesthesia including but not limited to:  - adverse reactions to medications - risk of intubation if required - damage to teeth, lips or other oral mucosa - sore throat or hoarseness - Damage to heart, brain, lungs or loss of life  Patient voiced understanding.)        Anesthesia Quick Evaluation

## 2017-10-15 NOTE — Transfer of Care (Signed)
Immediate Anesthesia Transfer of Care Note  Patient: Sarye Kath  Procedure(s) Performed: COLONOSCOPY WITH PROPOFOL (N/A )  Patient Location: PACU  Anesthesia Type:MAC  Level of Consciousness: awake, alert  and oriented  Airway & Oxygen Therapy: Patient Spontanous Breathing and Patient connected to nasal cannula oxygen  Post-op Assessment: Report given to RN and Post -op Vital signs reviewed and stable  Post vital signs: stable  Last Vitals:  Vitals Value Taken Time  BP    Temp    Pulse 72 10/15/2017 11:52 AM  Resp 16 10/15/2017 11:52 AM  SpO2 100 % 10/15/2017 11:52 AM  Vitals shown include unvalidated device data.  Last Pain:  Vitals:   10/15/17 1058  TempSrc: Tympanic  PainSc: 0-No pain         Complications: No apparent anesthesia complications

## 2017-10-15 NOTE — H&P (Signed)
Wyline Mood, MD [address], [address], [place], [place], [address] [address], [place], [place], [address] Phone: [phone]  Fax: [phone]  Primary Care Physician:  Gabriel Cirri, NP   Pre-Procedure History & Physical: HPI:  Susan Calderon is a 52 y.o. female is here for an colonoscopy.   Past Medical History:  Diagnosis Date  . Allergy   . Rosacea   . Tobacco use disorder     Past Surgical History:  Procedure Laterality Date  . BUNIONECTOMY    . CESAREAN SECTION    . TONSILLECTOMY    . TUBAL LIGATION      Prior to Admission medications   Medication Sig Start Date End Date Taking? Authorizing Provider  cholecalciferol (VITAMIN D) 1000 UNITS tablet Take 1,000 Units by mouth daily.     Yes [provider]  citalopram (CELEXA) 10 MG tablet Take 1 tablet (10 mg total) by mouth daily. 09/18/17  Yes Gabriel Cirri, NP  clonazePAM (KLONOPIN) 0.5 MG tablet Take 1 tablet (0.5 mg total) by mouth 2 (two) times daily as needed for anxiety. 05/01/17  Yes Gabriel Cirri, NP  Cyanocobalamin (VITAMIN B 12 PO) Take 1 tablet by mouth daily.     Yes [provider]  Fish Oil-Cholecalciferol (FISH OIL + D3) 1000-1000 MG-UNIT CAPS Take by mouth.   Yes [provider]  fluticasone (FLONASE) 50 MCG/ACT nasal spray Place 2 sprays as needed into both nostrils.    Yes [provider]  Multiple Vitamin (MULTIVITAMIN) tablet Take 1 tablet by mouth daily.   Yes [provider]  vitamin C (ASCORBIC ACID) 500 MG tablet Take 500 mg by mouth daily.     Yes [provider]  vitamin E 200 UNIT capsule Take 200 Units by mouth daily.   Yes [provider]  acetaminophen (TYLENOL 8 HOUR ARTHRITIS PAIN) 650 MG CR tablet Take 650 mg by mouth every 8 (eight) hours as needed for pain.    [provider]  doxycycline (VIBRAMYCIN) 50 MG capsule TAKE 1 CAPSULE BY MOUTH 2 TIMES DAILY. 09/13/17   Gabriel Cirri, NP  ibuprofen  (ADVIL,MOTRIN) 200 MG tablet Take 800 mg by mouth every 6 (six) hours as needed.    [provider]    Allergies as of 10/04/2017 - Review Complete 09/18/2017  Allergen Reaction Noted  . Tree extract  08/16/2016    Family History  Problem Relation Age of Onset  . Hypothyroidism Mother   . Heart attack Father   . CAD Father   . Stroke Maternal Grandmother   . Breast cancer Neg Hx     Social History   Socioeconomic History  . Marital status: Divorced    Spouse name: Not on file  . Number of children: Not on file  . Years of education: Not on file  . Highest education level: Not on file  Occupational History  . Not on file  Social Needs  . Financial resource strain: Not on file  . Food insecurity:    Worry: Not on file    Inability: Not on file  . Transportation needs:    Medical: Not on file    Non-medical: Not on file  Tobacco Use  . Smoking status: Current Every Day Smoker    Packs/day: 0.75    Types: Cigarettes  . Smokeless tobacco: Never Used  Substance and Sexual Activity  . Alcohol use: Yes    Comment: on occasion  . Drug use: No  .  Sexual activity: Never  Lifestyle  . Physical activity:    Days per week: Not on file    Minutes per session: Not on file  . Stress: Not on file  Relationships  . Social connections:    Talks on phone: Not on file    Gets together: Not on file    Attends religious service: Not on file    Active member of club or organization: Not on file    Attends meetings of clubs or organizations: Not on file    Relationship status: Not on file  . Intimate partner violence:    Fear of current or ex partner: Not on file    Emotionally abused: Not on file    Physically abused: Not on file    Forced sexual activity: Not on file  Other Topics Concern  . Not on file  Social History Narrative  . Not on file    Review of Systems: See HPI, otherwise negative ROS  Physical Exam: BP 96/74   Pulse 85   Temp (!) 97.1 F (36.2  C) (Tympanic)   Resp 16   Ht  (1.651 m)   Wt 132 lb (59.9 kg)   LMP 09/12/2017 Comment: Negative urine pregnancy 10-15-2017  SpO2 100%   BMI 21.97 kg/m  General:   Alert,  pleasant and cooperative in NAD Head:  Normocephalic and atraumatic. Neck:  Supple; no masses or thyromegaly. Lungs:  Clear throughout to auscultation, normal respiratory effort.    Heart:  +S1, +S2, Regular rate and rhythm, No edema. Abdomen:  Soft, nontender and nondistended. Normal bowel sounds, without guarding, and without rebound.   Neurologic:  Alert and  oriented x4;  grossly normal neurologically.  Impression/Plan: Susan Calderon is here for an colonoscopy to be performed for Screening colonoscopy average risk   Risks, benefits, limitations, and alternatives regarding  colonoscopy have been reviewed with the patient.  Questions have been answered.  All parties agreeable.   Wyline Mood, MD  10/15/2017, 11:22 AM

## 2017-10-15 NOTE — Op Note (Signed)
[facility] Gastroenterology Patient Name: Susan Calderon Procedure Date: 10/15/2017 11:25 AM MRN: [id number] Account #: [id number] Date of Birth: 1966-06-03 Admit Type: Ambulatory Age: 52 Room: [facility] ENDO ROOM 4 Gender: Female Note Status: Finalized Procedure:            Colonoscopy Indications:          Screening for colorectal malignant neoplasm Providers:            Wyline Mood MD, MD Referring MD:         Gabriel Cirri (Referring MD) Medicines:            Monitored Anesthesia Care Complications:        No immediate complications. Procedure:            Pre-Anesthesia Assessment:                       - Prior to the procedure, a History and Physical was                        performed, and patient medications, allergies and                        sensitivities were reviewed. The patient's tolerance of                        previous anesthesia was reviewed.                       - The risks and benefits of the procedure and the                        sedation options and risks were discussed with the                        patient. All questions were answered and informed                        consent was obtained.                       - ASA Grade Assessment: II - A patient with mild                        systemic disease.                       After obtaining informed consent, the colonoscope was                        passed under direct vision. Throughout the procedure,                        the patient's blood pressure, pulse, and oxygen                        saturations were monitored continuously. The                        Colonoscope was introduced through the anus and  advanced to the the cecum, identified by the                        appendiceal orifice, IC valve and transillumination.                        The colonoscopy was performed with ease. The                        colonoscopy was somewhat difficult due to a tortuous                       colon. The patient tolerated the procedure well. The                        quality of the bowel preparation was good. Findings:      The perianal and digital rectal examinations were normal.      The entire examined colon appeared normal on direct and retroflexion       views. Impression:           - The entire examined colon is normal on direct and                        retroflexion views.                       - No specimens collected. Recommendation:       - Discharge patient to home (with escort).                       - Resume previous diet.                       - Continue present medications.                       - Repeat colonoscopy in 10 years for screening purposes. Procedure Code(s):    --- Professional ---                       [phone], Colonoscopy, flexible; diagnostic, including                        collection of specimen(s) by brushing or washing, when                        performed (separate procedure) CPT copyright 2017 American Medical Association. All rights reserved. The codes documented in this report are preliminary and upon coder review may  be revised to meet current compliance requirements. Wyline Mood, MD Wyline Mood MD, MD 10/15/2017 11:51:09 AM This report has been signed electronically. Number of Addenda: 0 Note Initiated On: 10/15/2017 11:25 AM Scope Withdrawal Time: 0 hours 10 minutes 29 seconds  Total Procedure Duration: 0 hours 21 minutes 31 seconds       [facility]

## 2017-10-16 ENCOUNTER — Encounter: Payer: Self-pay | Admitting: Gastroenterology

## 2017-10-16 NOTE — Anesthesia Postprocedure Evaluation (Signed)
Anesthesia Post Note  Patient: Susan Calderon  Procedure(s) Performed: COLONOSCOPY WITH PROPOFOL (N/A )  Patient location during evaluation: Endoscopy Anesthesia Type: General Level of consciousness: awake and alert Pain management: pain level controlled Vital Signs Assessment: post-procedure vital signs reviewed and stable Respiratory status: spontaneous breathing, nonlabored ventilation, respiratory function stable and patient connected to nasal cannula oxygen Cardiovascular status: blood pressure returned to baseline and stable Postop Assessment: no apparent nausea or vomiting Anesthetic complications: no     Last Vitals:  Vitals:   10/15/17 1152 10/15/17 1212  BP: (!) 94/59 107/72  Pulse: 70   Resp: 18   Temp: (!) 36.1 C   SpO2: 100%     Last Pain:  Vitals:   10/15/17 1212  TempSrc:   PainSc: 0-No pain                 Cleda Mccreedy Manual Navarra

## 2017-10-24 ENCOUNTER — Ambulatory Visit: Payer: Managed Care, Other (non HMO) | Admitting: Unknown Physician Specialty

## 2017-10-24 ENCOUNTER — Other Ambulatory Visit: Payer: Self-pay | Admitting: Unknown Physician Specialty

## 2017-12-04 ENCOUNTER — Other Ambulatory Visit: Payer: Self-pay | Admitting: Unknown Physician Specialty

## 2017-12-06 NOTE — Telephone Encounter (Signed)
LOV 09-28-17 (annual physical) with Susan Calderon / Refilled request for Vibramycin / Last filled:  Disp Refills Start End   doxycycline (VIBRAMYCIN) 50 MG capsule 60 capsule 0 10/24/2017    Sig: TAKE 1 CAPSULE BY MOUTH 2 TIMES DAILY.    / Patient cancelled f/u appointment on 10-24-17 and has no future appointments at this time.

## 2017-12-07 NOTE — Telephone Encounter (Signed)
done

## 2018-01-11 ENCOUNTER — Ambulatory Visit: Payer: Managed Care, Other (non HMO) | Admitting: Family Medicine

## 2018-01-11 ENCOUNTER — Telehealth: Payer: Self-pay | Admitting: Unknown Physician Specialty

## 2018-01-11 DIAGNOSIS — M7072 Other bursitis of hip, left hip: Principal | ICD-10-CM

## 2018-01-11 DIAGNOSIS — M7071 Other bursitis of hip, right hip: Secondary | ICD-10-CM

## 2018-01-11 NOTE — Telephone Encounter (Signed)
[facility]   Thank you

## 2018-01-11 NOTE — Telephone Encounter (Signed)
Referral generated

## 2018-02-13 ENCOUNTER — Ambulatory Visit: Payer: Managed Care, Other (non HMO) | Admitting: Physician Assistant

## 2018-02-13 ENCOUNTER — Encounter: Payer: Self-pay | Admitting: Physician Assistant

## 2018-02-13 VITALS — BP 119/82 | HR 73 | Temp 98.0°F | Ht 65.1 in | Wt 136.2 lb

## 2018-02-13 DIAGNOSIS — J441 Chronic obstructive pulmonary disease with (acute) exacerbation: Secondary | ICD-10-CM | POA: Diagnosis not present

## 2018-02-13 DIAGNOSIS — L719 Rosacea, unspecified: Secondary | ICD-10-CM | POA: Diagnosis not present

## 2018-02-13 MED ORDER — DOXYCYCLINE HYCLATE 50 MG PO CAPS: 50.0000 mg | ORAL_CAPSULE | Freq: Two times a day (BID) | ORAL | 0 refills | Status: DC

## 2018-02-13 MED ORDER — AZITHROMYCIN 250 MG PO TABS
ORAL_TABLET | ORAL | 0 refills | Status: DC
Start: 2018-02-13 — End: 2018-10-10

## 2018-02-13 MED ORDER — PREDNISONE 20 MG PO TABS: 20.0000 mg | ORAL_TABLET | Freq: Every day | ORAL | 0 refills | Status: AC

## 2018-02-13 NOTE — Progress Notes (Signed)
   Subjective:    Patient ID: Susan Calderon, female    DOB: 08/10/1965, 52 y.o.   MRN: [id number]  Cedrica Calderon is a 52 y.o. female presenting on 02/13/2018 for Cough (pt states she has had a productive cough for a month )   HPI   Presents with cough x 1 month. Currently smokes 0.75 packs daily and has mild COPD based on 2016 spirometry. Has taken box of alkaseltzer sinus, mucinex, and mucinex sinus without improvement Coughing up yellowish, green mucous. Denies SOB. Had scratchy throat in the beginning, frequently cuts grass.   She also wants refill on doxycycline 50 mg BID for rosacea. Doing well with this, improves her rash.  Social History   Tobacco Use  . Smoking status: Current Every Day Smoker    Packs/day: 0.75    Types: Cigarettes  . Smokeless tobacco: Never Used  Substance Use Topics  . Alcohol use: Yes    Comment: on occasion  . Drug use: No    Review of Systems Per HPI unless specifically indicated above     Objective:    BP 119/82   Pulse 73   Temp 98 F (36.7 C) (Oral)   Ht 5' 5.1" (1.654 m)   Wt 136 lb 3.2 oz (61.8 kg)   SpO2 98%   BMI 22.60 kg/m   Wt Readings from Last 3 Encounters:  02/13/18 136 lb 3.2 oz (61.8 kg)  10/15/17 132 lb (59.9 kg)  09/18/17 137 lb 9.6 oz (62.4 kg)    Physical Exam  Constitutional: She is oriented to person, place, and time. She appears well-developed and well-nourished.  Cardiovascular: Normal rate and regular rhythm.  Pulmonary/Chest: Effort normal. She has wheezes.  Mild wheezing in scattered lung fields.   Neurological: She is alert and oriented to person, place, and time.  Skin: Skin is warm and dry.  Psychiatric: She has a normal mood and affect. Her behavior is normal.   Results for orders placed or performed during the hospital encounter of 10/15/17  Pregnancy, urine POC  Result Value Ref Range   Preg Test, Ur NEGATIVE NEGATIVE      Assessment & Plan:  1. COPD exacerbation (HCC)  Treat as below. She has albuterol  inhaler at home. Counseled on smoking cessation. Also should be taking daily zyrtec or other allergy medication to cover for allergies. She was supposed to to come back for follow up in May after starting Celexa, but has not done so. Encouraged to follow up in one month to establish care and for chronic refills with new provider.   - azithromycin (ZITHROMAX) 250 MG tablet; Take 2 pills on day 1, then take 1 pill each of the following 4 days.  Dispense: 6 each; Refill: 0 - predniSONE (DELTASONE) 20 MG tablet; Take 1 tablet (20 mg total) by mouth daily with breakfast for 5 days.  Dispense: 5 tablet; Refill: 0  2. Rosacea  - doxycycline (VIBRAMYCIN) 50 MG capsule; Take 1 capsule (50 mg total) by mouth 2 (two) times daily.  Dispense: 60 capsule; Refill: 0    Follow up plan: Return in about 1 month (around 03/15/2018) for chronic issues anxiety .  Osvaldo Angst, PA-C [facility] 02/13/2018, 8:33 AM

## 2018-02-13 NOTE — Patient Instructions (Signed)

## 2018-02-18 ENCOUNTER — Telehealth: Payer: Self-pay

## 2018-02-18 MED ORDER — PREDNISONE 50 MG PO TABS: 50.0000 mg | ORAL_TABLET | Freq: Every day | ORAL | 0 refills | Status: DC

## 2018-02-18 NOTE — Telephone Encounter (Signed)
Prednisone sent to her pharmacy. If not better with that, will need to be seen.

## 2018-02-18 NOTE — Telephone Encounter (Signed)
     Pt call to say she finish all her medication that was RX to her and she still has the cough and is asking if there is something else she can take   Does this pt need to come back in for another appt or can something be called in?  Patient was seen by Susan Calderon on 9/5 for a COPD exacerbation

## 2018-02-19 NOTE — Telephone Encounter (Signed)
Message relayed to patient. Verbalized understanding and denied questions.   

## 2018-09-27 ENCOUNTER — Ambulatory Visit: Payer: Managed Care, Other (non HMO) | Admitting: Physician Assistant

## 2018-10-10 ENCOUNTER — Encounter: Payer: Self-pay | Admitting: Physician Assistant

## 2018-10-10 ENCOUNTER — Other Ambulatory Visit: Payer: Self-pay | Admitting: Physician Assistant

## 2018-10-10 ENCOUNTER — Ambulatory Visit: Payer: Managed Care, Other (non HMO) | Admitting: Physician Assistant

## 2018-10-10 ENCOUNTER — Other Ambulatory Visit: Payer: Self-pay

## 2018-10-10 VITALS — BP 106/71 | HR 80 | Temp 98.7°F | Resp 16 | Wt 139.0 lb

## 2018-10-10 DIAGNOSIS — F419 Anxiety disorder, unspecified: Secondary | ICD-10-CM

## 2018-10-10 DIAGNOSIS — M7582 Other shoulder lesions, left shoulder: Secondary | ICD-10-CM

## 2018-10-10 DIAGNOSIS — M7711 Lateral epicondylitis, right elbow: Secondary | ICD-10-CM

## 2018-10-10 DIAGNOSIS — Z0000 Encounter for general adult medical examination without abnormal findings: Secondary | ICD-10-CM | POA: Diagnosis not present

## 2018-10-10 DIAGNOSIS — M7062 Trochanteric bursitis, left hip: Secondary | ICD-10-CM

## 2018-10-10 DIAGNOSIS — M778 Other enthesopathies, not elsewhere classified: Secondary | ICD-10-CM

## 2018-10-10 DIAGNOSIS — F17200 Nicotine dependence, unspecified, uncomplicated: Secondary | ICD-10-CM

## 2018-10-10 DIAGNOSIS — M7581 Other shoulder lesions, right shoulder: Secondary | ICD-10-CM

## 2018-10-10 DIAGNOSIS — Z1239 Encounter for other screening for malignant neoplasm of breast: Secondary | ICD-10-CM | POA: Diagnosis not present

## 2018-10-10 DIAGNOSIS — J449 Chronic obstructive pulmonary disease, unspecified: Secondary | ICD-10-CM | POA: Diagnosis not present

## 2018-10-10 DIAGNOSIS — Z124 Encounter for screening for malignant neoplasm of cervix: Secondary | ICD-10-CM

## 2018-10-10 DIAGNOSIS — L719 Rosacea, unspecified: Secondary | ICD-10-CM

## 2018-10-10 DIAGNOSIS — M7061 Trochanteric bursitis, right hip: Secondary | ICD-10-CM

## 2018-10-10 MED ORDER — MELOXICAM 15 MG PO TABS: 15.0000 mg | ORAL_TABLET | Freq: Every day | ORAL | 0 refills | Status: DC

## 2018-10-10 NOTE — Patient Instructions (Signed)

## 2018-10-10 NOTE — Progress Notes (Signed)
Patient: Susan Calderon, Female    DOB: [birth year], 53 y.o.   MRN: [id number] Visit Date: 10/10/2018  Today's Provider: Trey Sailors, PA-C   Chief Complaint  Patient presents with  . Annual Exam   Subjective:     Annual physical exam Susan Calderon is a 53 y.o. female who presents today for health maintenance and complete physical. She feels WELL. She reports exercising 3 times a week. She reports she is sleeping POORLY, due to night sweats, and waking every 2 hours. Patient is also having complaints tennis elbow in the right arm that has started a few days ago.  Living in [place], [place]. Lives with significant other of 15 years, two children - aged 62 and 84. Works as a Building services engineer at [employer].   Tobacco Abuse: Has smoked 35 years, and 1 pack a day for those 35 years. Smokes < 1 pack per day. COPD. Interested in quitting, but wants to check with insurance about what payment plans there are for cessation aids.  Colonoscopy: 10/15/2017 normal with no polyps, repeat 10 years PAP: 2015, results unavailable, due today  Previously on celexa, but has since stopped this and reports her mood is finel  Previously on doxycycline for acne, but has stopped this. Reports skin remains the same.   Reports she is having a period every few months.   Reports she works for [employer] and scans a lot of items with her right hand, which is her dominant hand. She also was working in the yard over the weekend and is now experiencing pain and tenderness on the outside of her right elbow.   -----------------------------------------------------------------   Review of Systems  Constitutional: Positive for fatigue.  HENT: Negative.   Eyes: Negative.   Respiratory: Negative.   Cardiovascular: Negative.   Gastrointestinal: Negative.   Endocrine: Negative.   Genitourinary: Negative.   Musculoskeletal: Positive for joint swelling.  Skin: Negative.   Allergic/Immunologic: Negative.   Neurological:  Negative.   Hematological: Negative.   Psychiatric/Behavioral: Negative.     Social History      She  reports that she has been smoking cigarettes. She has been smoking about 0.75 packs per day. She has never used smokeless tobacco. She reports current alcohol use. She reports that she does not use drugs.       Social History   Socioeconomic History  . Marital status: Divorced    Spouse name: Not on file  . Number of children: Not on file  . Years of education: Not on file  . Highest education level: Not on file  Occupational History  . Not on file  Social Needs  . Financial resource strain: Not on file  . Food insecurity:    Worry: Not on file    Inability: Not on file  . Transportation needs:    Medical: Not on file    Non-medical: Not on file  Tobacco Use  . Smoking status: Current Every Day Smoker    Packs/day: 0.75    Types: Cigarettes  . Smokeless tobacco: Never Used  Substance and Sexual Activity  . Alcohol use: Yes    Comment: on occasion  . Drug use: No  . Sexual activity: Never  Lifestyle  . Physical activity:    Days per week: Not on file    Minutes per session: Not on file  . Stress: Not on file  Relationships  . Social connections:    Talks on phone: Not on file  Gets together: Not on file    Attends religious service: Not on file    Active member of club or organization: Not on file    Attends meetings of clubs or organizations: Not on file    Relationship status: Not on file  Other Topics Concern  . Not on file  Social History Narrative  . Not on file    Past Medical History:  Diagnosis Date  . Allergy   . Rosacea   . Tobacco use disorder      Patient Active Problem List   Diagnosis Date Noted  . Tendonitis of both shoulders 09/18/2017  . Menopause 09/18/2017  . Anxiety 09/18/2017  . Bilateral hip bursitis 06/11/2015  . 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 Rosacea 12/31/2014  . Tobacco use disorder 12/31/2014  . Allergic rhinitis  12/31/2014    Past Surgical History:  Procedure Laterality Date  . BUNIONECTOMY    . CESAREAN SECTION    . COLONOSCOPY WITH PROPOFOL N/A 10/15/2017   Procedure: COLONOSCOPY WITH PROPOFOL;  Surgeon: Wyline MoodAnna, Kiran, MD;  [facility];  Service: Gastroenterology;  Laterality: N/A;  . TONSILLECTOMY    . TUBAL LIGATION      Family History        Family Status  Relation Name Status  . Mother  Alive  . Father  Deceased at age 52  . Sister  Alive  . Brother  Alive  . Daughter  Alive  . Son  Alive  . MGM  Deceased  . MGF  Deceased  . PGM  Deceased  . PGF  Deceased  . Brother  Alive  . Brother  Alive  . Brother  Alive  . Sister  Alive  . Neg Hx  (Not Specified)        Her family history includes CAD in her father; Heart attack in her father; Hypertension in her mother; Hypothyroidism in her mother; Osteoporosis in her mother; Stroke in her maternal grandmother. There is no history of Breast cancer.      Allergies  Allergen Reactions  . Tree Extract     unknown     Current Outpatient Medications:  .  Cyanocobalamin (VITAMIN B 12 PO), Take 1 tablet by mouth daily.  , Disp: , Rfl:  .  Fish Oil-Cholecalciferol (FISH OIL + D3) 1000-1000 MG-UNIT CAPS, Take by mouth., Disp: , Rfl:  .  Multiple Vitamin (MULTIVITAMIN) tablet, Take 1 tablet by mouth daily., Disp: , Rfl:  .  vitamin C (ASCORBIC ACID) 500 MG tablet, Take 500 mg by mouth daily.  , Disp: , Rfl:  .  vitamin E 200 UNIT capsule, Take 200 Units by mouth daily., Disp: , Rfl:  .  acetaminophen (TYLENOL 8 HOUR ARTHRITIS PAIN) 650 MG CR tablet, Take 650 mg by mouth every 8 (eight) hours as needed for pain., Disp: , Rfl:  .  cholecalciferol (VITAMIN D) 1000 UNITS tablet, Take 1,000 Units by mouth daily.  , Disp: , Rfl:  .  fluticasone (FLONASE) 50 MCG/ACT nasal spray, Place 2 sprays as needed into both nostrils. , Disp: , Rfl:  .  ibuprofen (ADVIL,MOTRIN) 200 MG tablet, Take 800 mg by mouth every 6 (six) hours as needed.,  Disp: , Rfl:  .  meloxicam (MOBIC) 15 MG tablet, Take 1 tablet (15 mg total) by mouth daily for 30 days., Disp: 30 tablet, Rfl: 0   Patient Care Team: Maryella ShiversPollak, Adriana M, PA-C as PCP - General (Physician Assistant) Kieth BrightlySankar, Seeplaputhur G, MD (General Surgery)  Gabriel Cirri, NP as Nurse Practitioner (Nurse Practitioner)    Objective:    Vitals: BP 106/71 (BP Location: Left Arm, Patient Position: Sitting, Cuff Size: Normal)   Pulse 80   Temp 98.7 F (37.1 C) (Oral)   Resp 16   Wt 139 lb (63 kg)   BMI 23.06 kg/m    Vitals:   10/10/18 0820  BP: 106/71  Pulse: 80  Resp: 16  Temp: 98.7 F (37.1 C)  TempSrc: Oral  Weight: 139 lb (63 kg)     Physical Exam Exam conducted with a chaperone present.  Constitutional:      Appearance: Normal appearance.  HENT:     Right Ear: Tympanic membrane and ear canal normal.     Left Ear: Tympanic membrane and ear canal normal.     Mouth/Throat:     Mouth: Mucous membranes are moist. No oral lesions.     Pharynx: Oropharynx is clear.  Cardiovascular:     Rate and Rhythm: Normal rate and regular rhythm.     Heart sounds: Normal heart sounds.  Pulmonary:     Effort: Pulmonary effort is normal.     Breath sounds: Normal breath sounds.  Chest:     Breasts:        Right: Normal.        Left: Normal.     Comments: Cystic feeling densities on lateral aspects of both breasts.  Abdominal:     General: Abdomen is flat. Bowel sounds are normal.     Palpations: Abdomen is soft.  Genitourinary:    Exam position: Lithotomy position.     Vagina: Normal.     Cervix: Normal.     Uterus: Normal.      Adnexa: Right adnexa normal and left adnexa normal.  Skin:    General: Skin is warm and dry.  Neurological:     Mental Status: She is alert.  Psychiatric:        Mood and Affect: Mood normal.        Behavior: Behavior normal.      Depression Screen PHQ 2/9 Scores 10/10/2018 10/10/2018 09/18/2017 05/01/2017  PHQ - 2 Score 0 0 0 0  PHQ- 9 Score  - - 6 0       Assessment & Plan:     Routine Health Maintenance and Physical Exam  Exercise Activities and Dietary recommendations Goals   None     Immunization History  Administered Date(s) Administered  . Influenza-Unspecified 03/22/2016  . Pneumococcal Polysaccharide-23 03/11/2008  . Td 03/21/2006  . Tdap 04/28/2016    Health Maintenance  Topic Date Due  . INFLUENZA VACCINE  01/11/2019  . PAP SMEAR-Modifier  05/13/2019  . MAMMOGRAM  08/23/2019  . TETANUS/TDAP  04/28/2026  . COLONOSCOPY  10/16/2027  . HIV Screening  Completed     Discussed health benefits of physical activity, and encouraged her to engage in regular exercise appropriate for her age and condition.    1. Annual physical exam  - Comprehensive Metabolic Panel (CMET) - CBC with Differential - TSH - Lipid Profile  2. Breast cancer screening  Contact card given.   - MM Digital Screening; Future  3. Tendonitis of both shoulders   4. COPD, mild (HCC)  Counseled on smoking cessation and benefits of this as well as harms of continuing smoking. Counseled > 3 min. She will call her insurance company to see which medications are paid for and then plan to start either Chantix or Wellbutrin.  5. Anxiety  She has discontinued celexa and reports her mood feels fine.  6. Tobacco use disorder   7. Rosacea   8. Trochanteric bursitis of both hips   9. Lateral epicondylitis of right elbow  Advised rest, ice, NSAIDs. May use elbow band. May need orthopedic referral if conservative measures not effective.   - meloxicam (MOBIC) 15 MG tablet; Take 1 tablet (15 mg total) by mouth daily for 30 days.  Dispense: 30 tablet; Refill: 0  10. Cervical cancer screening  - Cytology - PAP  The entirety of the information documented in the History of Present Illness, Review of Systems and Physical Exam were personally obtained by me. Portions of this information were initially documented by Lexine Baton,  LPN and reviewed by me for thoroughness and accuracy.   F/u 1 year.   --------------------------------------------------------------------    Trey Sailors, PA-C  [facility]

## 2018-10-11 LAB — COMPREHENSIVE METABOLIC PANEL
ALT: 16 IU/L (ref 0–32)
AST: 20 IU/L (ref 0–40)
Albumin/Globulin Ratio: 2.7 — ABNORMAL HIGH (ref 1.2–2.2)
Albumin: 4.6 g/dL (ref 3.8–4.9)
Alkaline Phosphatase: 70 IU/L (ref 39–117)
BUN/Creatinine Ratio: 20 (ref 9–23)
BUN: 17 mg/dL (ref 6–24)
Bilirubin Total: 0.3 mg/dL (ref 0.0–1.2)
CO2: 22 mmol/L (ref 20–29)
Calcium: 9.4 mg/dL (ref 8.7–10.2)
Chloride: 104 mmol/L (ref 96–106)
Creatinine, Ser: 0.87 mg/dL (ref 0.57–1.00)
GFR calc Af Amer: 89 mL/min/{1.73_m2} (ref >59)
GFR calc non Af Amer: 77 mL/min/{1.73_m2} (ref >59)
Globulin, Total: 1.7 g/dL (ref 1.5–4.5)
Glucose: 76 mg/dL (ref 65–99)
Potassium: 3.6 mmol/L (ref 3.5–5.2)
Sodium: 146 mmol/L — ABNORMAL HIGH (ref 134–144)
Total Protein: 6.3 g/dL (ref 6.0–8.5)

## 2018-10-11 LAB — CBC WITH DIFFERENTIAL/PLATELET
Basophils Absolute: 0 10*3/uL (ref 0.0–0.2)
Basos: 1 %
EOS (ABSOLUTE): 0.2 10*3/uL (ref 0.0–0.4)
Eos: 4 %
Hematocrit: 38.9 % (ref 34.0–46.6)
Hemoglobin: 13.4 g/dL (ref 11.1–15.9)
Immature Grans (Abs): 0 10*3/uL (ref 0.0–0.1)
Immature Granulocytes: 0 %
Lymphocytes Absolute: 1.2 10*3/uL (ref 0.7–3.1)
Lymphs: 26 %
MCH: 32.1 pg (ref 26.6–33.0)
MCHC: 34.4 g/dL (ref 31.5–35.7)
MCV: 93 fL (ref 79–97)
Monocytes Absolute: 0.6 10*3/uL (ref 0.1–0.9)
Monocytes: 13 %
Neutrophils Absolute: 2.6 10*3/uL (ref 1.4–7.0)
Neutrophils: 56 %
Platelets: 245 10*3/uL (ref 150–450)
RBC: 4.18 x10E6/uL (ref 3.77–5.28)
RDW: 12.6 % (ref 11.7–15.4)
WBC: 4.6 10*3/uL (ref 3.4–10.8)

## 2018-10-11 LAB — LIPID PANEL
Chol/HDL Ratio: 2.2 ratio (ref 0.0–4.4)
Cholesterol, Total: 168 mg/dL (ref 100–199)
HDL: 78 mg/dL (ref >39)
LDL Calculated: 69 mg/dL (ref 0–99)
Triglycerides: 105 mg/dL (ref 0–149)
VLDL Cholesterol Cal: 21 mg/dL (ref 5–40)

## 2018-10-11 LAB — TSH: TSH: 1.91 u[IU]/mL (ref 0.450–4.500)

## 2018-10-11 NOTE — Addendum Note (Signed)
Addended by: Leafy Kindle on: 10/11/2018 11:23 AM   Modules accepted: Orders

## 2018-10-15 ENCOUNTER — Telehealth: Payer: Self-pay

## 2018-10-15 NOTE — Telephone Encounter (Signed)
Left patient a message advising her that labs were normal.

## 2018-10-15 NOTE — Telephone Encounter (Signed)
-----   Message from Trey Sailors, [place] sent at 10/14/2018  1:37 PM EDT ----- Labs normal.

## 2018-10-17 ENCOUNTER — Encounter: Payer: Managed Care, Other (non HMO) | Admitting: Family Medicine

## 2018-10-19 LAB — PAP LB AND HPV HIGH-RISK
HPV, high-risk: NEGATIVE
PAP Smear Comment: 0

## 2018-10-21 ENCOUNTER — Telehealth: Payer: Self-pay

## 2018-10-21 NOTE — Telephone Encounter (Signed)
Patient was advised.  

## 2018-10-21 NOTE — Telephone Encounter (Signed)
-----   Message from Trey Sailors, [place] sent at 10/21/2018  1:10 PM EDT ----- Pap normal and HPV negative. Repeat 5 years.

## 2018-10-21 NOTE — Telephone Encounter (Signed)
LMTCB

## 2018-12-10 ENCOUNTER — Other Ambulatory Visit: Payer: Self-pay | Admitting: Physician Assistant

## 2018-12-10 DIAGNOSIS — M7711 Lateral epicondylitis, right elbow: Secondary | ICD-10-CM

## 2019-01-02 ENCOUNTER — Other Ambulatory Visit: Payer: Self-pay

## 2019-01-02 ENCOUNTER — Ambulatory Visit
Admission: RE | Admit: 2019-01-02 | Discharge: 2019-01-02 | Disposition: A | Discharge status: Home / Self Care | Payer: Managed Care, Other (non HMO) | Source: Ambulatory Visit | Attending: Physician Assistant | Admitting: Physician Assistant

## 2019-01-02 DIAGNOSIS — Z1231 Encounter for screening mammogram for malignant neoplasm of breast: Secondary | ICD-10-CM | POA: Diagnosis not present

## 2019-01-02 DIAGNOSIS — Z1239 Encounter for other screening for malignant neoplasm of breast: Secondary | ICD-10-CM

## 2019-01-16 ENCOUNTER — Other Ambulatory Visit: Payer: Self-pay | Admitting: Physician Assistant

## 2019-01-16 DIAGNOSIS — M7711 Lateral epicondylitis, right elbow: Secondary | ICD-10-CM

## 2019-01-16 DIAGNOSIS — L719 Rosacea, unspecified: Secondary | ICD-10-CM

## 2019-01-16 NOTE — Telephone Encounter (Signed)
I've refilled meloxicam but only for two weeks. This is not a medication I would take every day as it can injury your kidneys and cause ulcers. Please take sparingly and alternate with tylenol for pain control.

## 2019-01-16 NOTE — Telephone Encounter (Signed)
Left message to call back regarding medications. 

## 2019-02-18 IMAGING — US US BREAST*R* LIMITED INC AXILLA
1 series · 8 of 8 positions shown · non-contrast
Comparison: Previous exam(s).

CLINICAL DATA: New mass in the anterior aspect of the upper right
breast slightly laterally on a recent 3D screening mammogram.

EXAM:
ULTRASOUND OF THE RIGHT BREAST

[Series 1: us breast*right* limited inc axilla · 0.06mm/px · 8 of 8 slices shown]
[im 1/8]
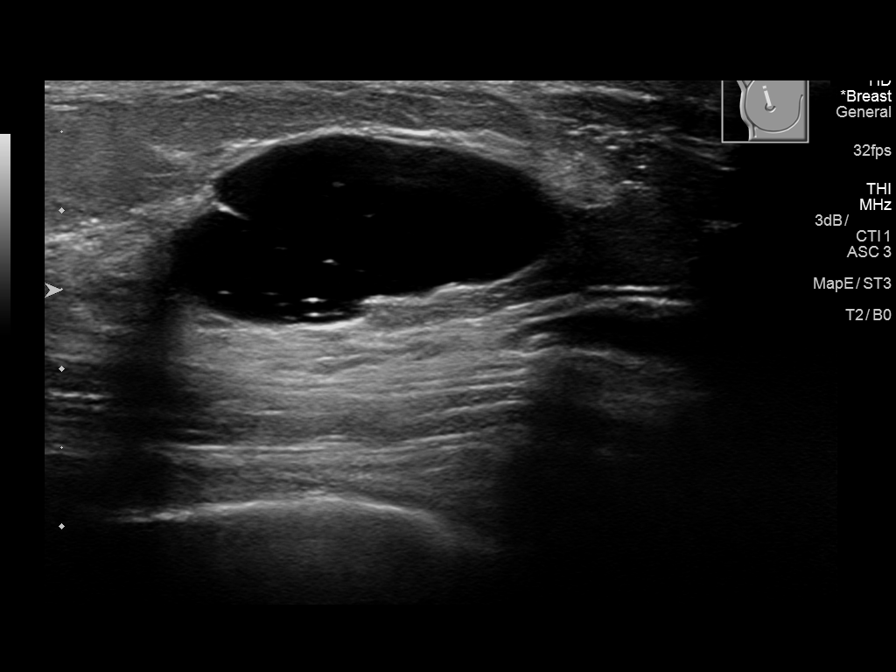
[im 2/8]
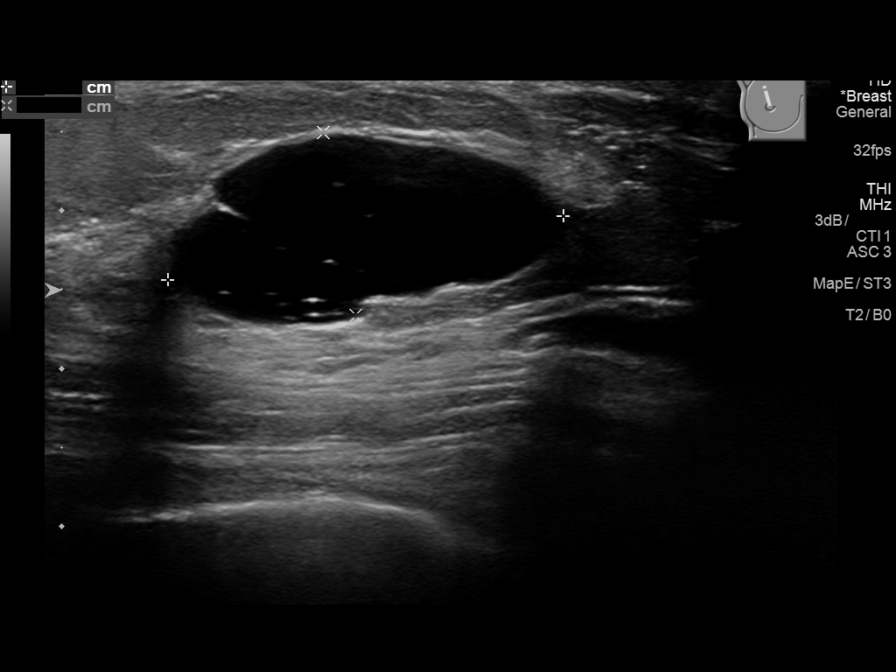
[im 3/8]
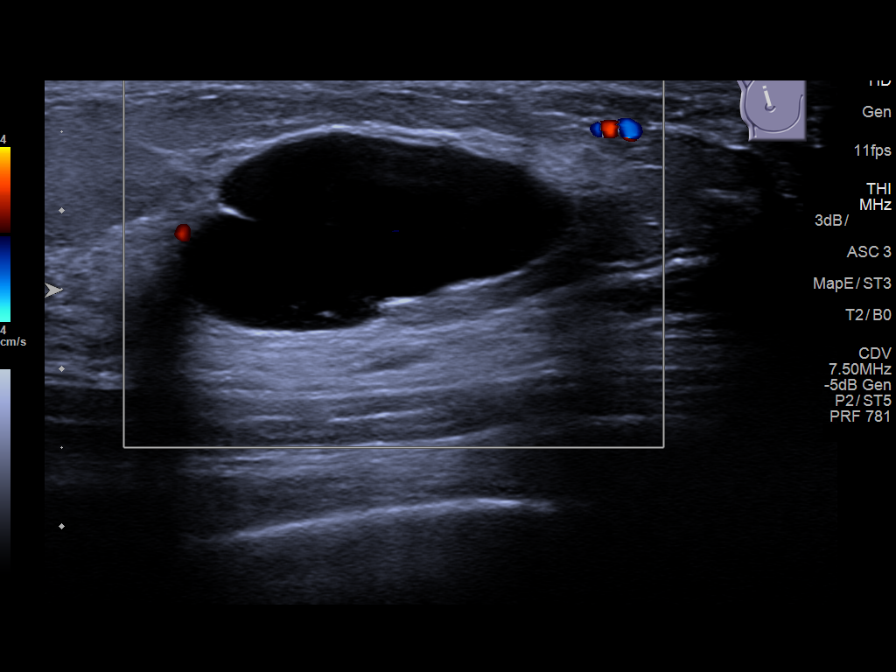
[im 4/8]
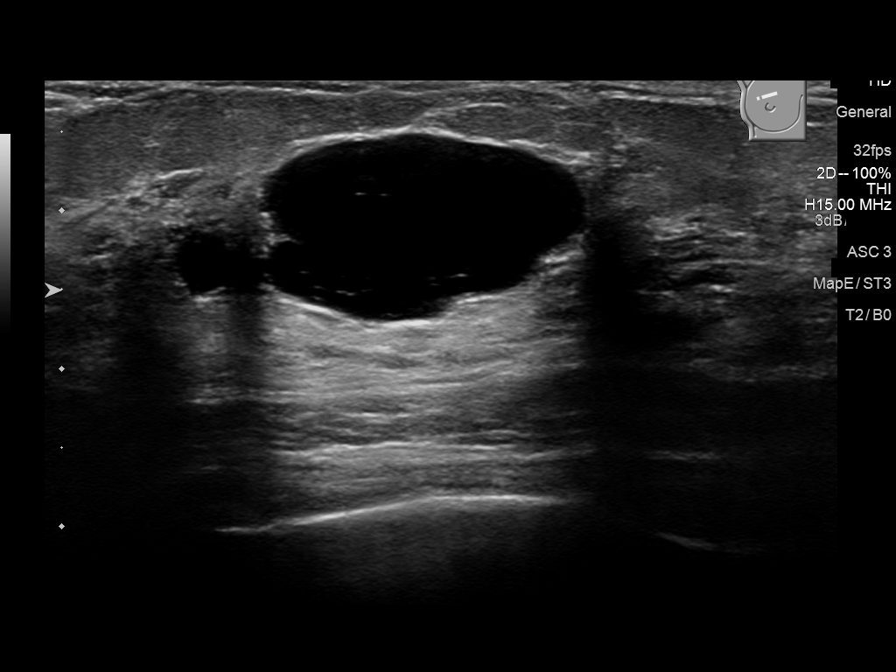
[im 5/8]
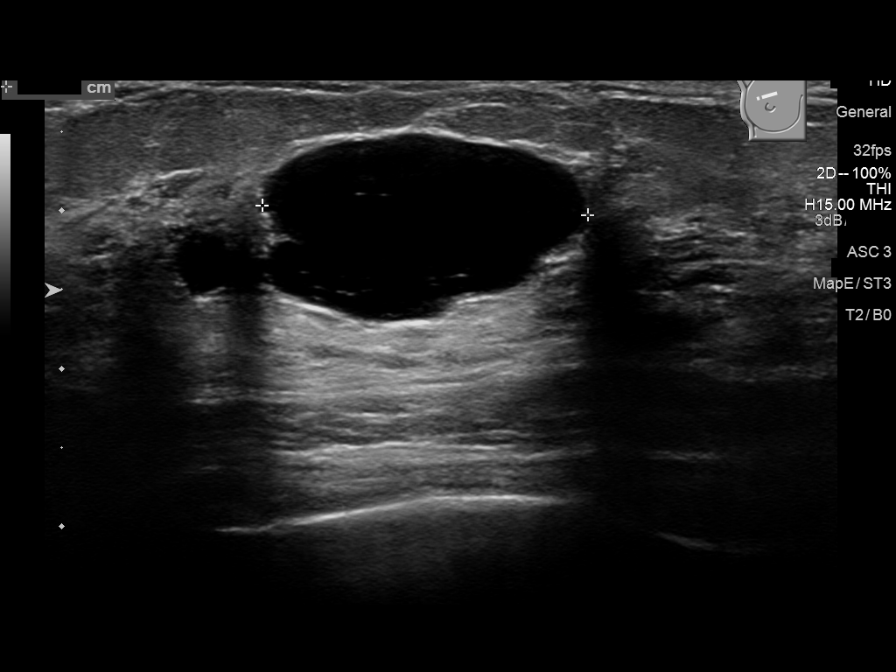
[im 6/8]
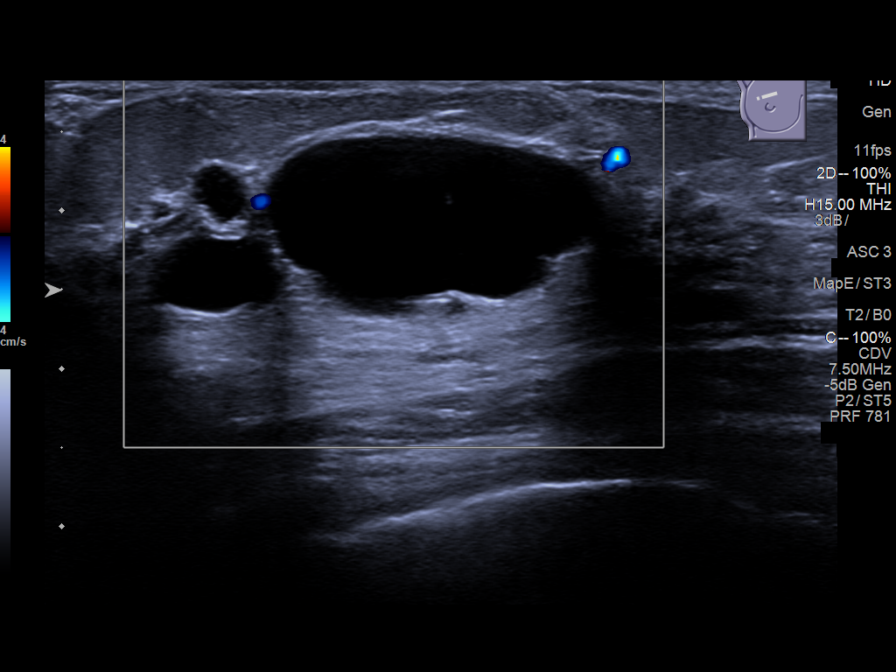
[im 7/8]
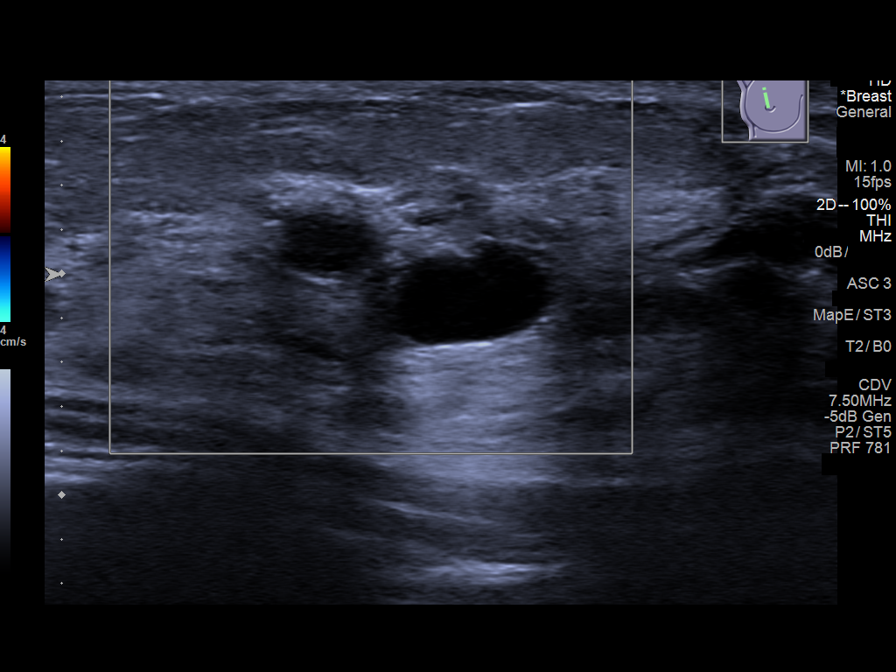
[im 8/8]
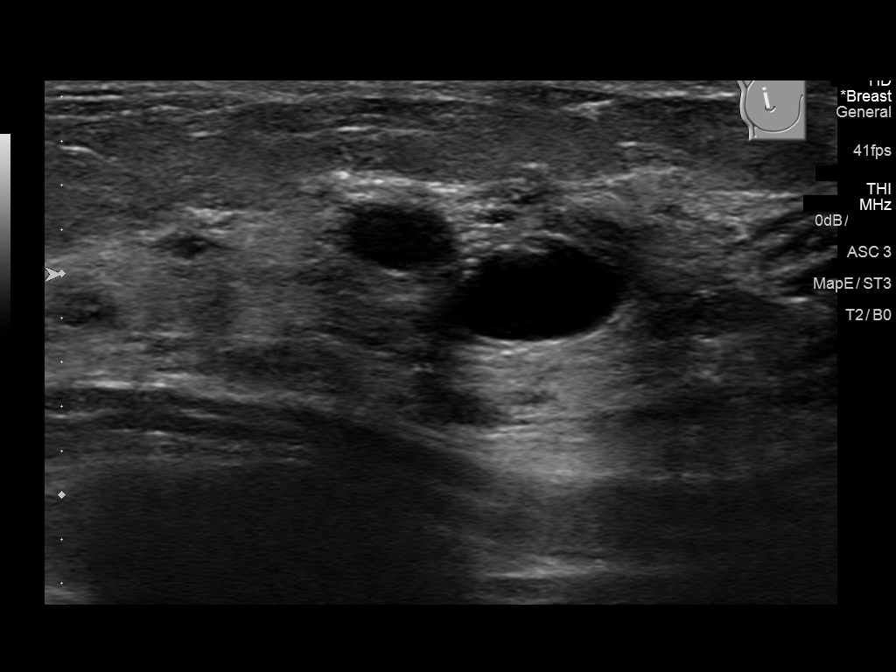

[8 of 8 positions shown; findings below may reference images not displayed]

FINDINGS: Targeted ultrasound is performed, showing a 2.5 cm cyst containing a
thin partial internal septation and a small number of small
particles of floating debris in the 11:30 o'clock position of the
right breast, 3 cm from the nipple. This corresponds to the
mammographic mass. There are multiple additional smaller cysts in
that region of the breast.
IMPRESSION: Benign right breast cysts.  No evidence of malignancy.

RECOMMENDATION:
Bilateral screening mammogram in 1 year.

I have discussed the findings and recommendations with the patient.
Results were also provided in writing at the conclusion of the
visit. If applicable, a reminder letter will be sent to the patient
regarding the next appointment.

BI-RADS CATEGORY  2: Benign.

## 2019-03-25 ENCOUNTER — Other Ambulatory Visit: Payer: Self-pay

## 2019-03-25 DIAGNOSIS — Z20822 Contact with and (suspected) exposure to covid-19: Secondary | ICD-10-CM

## 2019-03-27 LAB — NOVEL CORONAVIRUS, NAA: SARS-CoV-2, NAA: NOT DETECTED

## 2019-04-25 ENCOUNTER — Other Ambulatory Visit: Payer: Self-pay | Admitting: Physician Assistant

## 2019-04-25 DIAGNOSIS — L719 Rosacea, unspecified: Secondary | ICD-10-CM

## 2019-04-29 ENCOUNTER — Telehealth: Payer: Self-pay | Admitting: Physician Assistant

## 2019-04-29 DIAGNOSIS — L719 Rosacea, unspecified: Secondary | ICD-10-CM

## 2019-04-29 MED ORDER — DOXYCYCLINE HYCLATE 50 MG PO CAPS: 50.0000 mg | ORAL_CAPSULE | Freq: Two times a day (BID) | ORAL | 0 refills | Status: DC

## 2019-04-29 NOTE — Telephone Encounter (Signed)
I don't know why that is, I didn't deny it. Sent a refill today.

## 2019-04-29 NOTE — Telephone Encounter (Signed)
From [facility], please review 

## 2019-04-29 NOTE — Telephone Encounter (Signed)
Pt would like to know why doxycycline was denied. Pt takes for acne on her face. [facility] in [place]. Pt has been taking doxy for over 10 years

## 2019-04-29 NOTE — Telephone Encounter (Signed)
Pt is aware covid 19 test in oct was neg

## 2019-04-30 NOTE — Telephone Encounter (Signed)
Left message of patient's voice message system that prescription has been sent to the pharmacy ([facility]).

## 2019-07-18 ENCOUNTER — Other Ambulatory Visit: Payer: Self-pay | Admitting: Physician Assistant

## 2019-07-18 DIAGNOSIS — L719 Rosacea, unspecified: Secondary | ICD-10-CM

## 2019-07-18 NOTE — Telephone Encounter (Signed)
Please advise? Patient last office visit 10/10/18.

## 2019-10-22 ENCOUNTER — Other Ambulatory Visit: Payer: Self-pay | Admitting: Physician Assistant

## 2019-10-22 DIAGNOSIS — L719 Rosacea, unspecified: Secondary | ICD-10-CM

## 2019-10-22 NOTE — Telephone Encounter (Signed)
Requested medication (s) are due for refill today - yes  Requested medication (s) are on the active medication list -yes  Future visit scheduled -no  Last refill: 07/18/19  Notes to clinic: Request for medication not assigned protocol  Requested Prescriptions  Pending Prescriptions Disp Refills   doxycycline (VIBRAMYCIN) 50 MG capsule [Pharmacy Med Name: DOXYCYCLINE HYCLATE 50 MG C 50 Capsule] 60 capsule 0    Sig: TAKE 1 CAPSULE BY MOUTH 2 TIMES DAILY.      Off-Protocol Failed - 10/22/2019  1:07 PM      Failed - Medication not assigned to a protocol, review manually.      Failed - Valid encounter within last 12 months    Recent Outpatient Visits           1 year ago Annual physical exam   [facility] Trey Sailors, [place]   1 year ago COPD exacerbation [facility])   [facility] Trey Sailors, PA-C   2 years ago Annual physical exam   [facility] Gabriel Cirri, NP   2 years ago Chest pain, unspecified type   [facility] Gabriel Cirri, NP   2 years ago Trochanteric bursitis of both hips   [facility] Gabriel Cirri, NP                  Requested Prescriptions  Pending Prescriptions Disp Refills   doxycycline (VIBRAMYCIN) 50 MG capsule [Pharmacy Med Name: DOXYCYCLINE HYCLATE 50 MG C 50 Capsule] 60 capsule 0    Sig: TAKE 1 CAPSULE BY MOUTH 2 TIMES DAILY.      Off-Protocol Failed - 10/22/2019  1:07 PM      Failed - Medication not assigned to a protocol, review manually.      Failed - Valid encounter within last 12 months    Recent Outpatient Visits           1 year ago Annual physical exam   [facility] Trey Sailors, [place]   1 year ago COPD exacerbation [facility])   [facility] Trey Sailors, [place]   2 years ago Annual physical exam   [facility] Gabriel Cirri, NP   2 years ago Chest pain, unspecified type   [facility] Gabriel Cirri, NP    2 years ago Trochanteric bursitis of both hips   [facility] Gabriel Cirri, NP

## 2019-10-22 NOTE — Telephone Encounter (Signed)
Patient needs to be scheduled for CPE and follow up before more refills. I have refilled x 30 days but will not refill it after this.

## 2019-10-23 NOTE — Progress Notes (Signed)
Complete physical exam   Patient: Susan Calderon   DOB: 1965/10/08   54 y.o. Female  MRN: [id number] Visit Date: 10/24/2019  Today's healthcare provider: Trey Sailors, PA-C   Chief Complaint  Patient presents with  . Annual Exam  . Nicotine Dependence  I,Loreley Schwall M Temica Righetti,acting as a scribe for Trey Sailors, PA-C.,have documented all relevant documentation on the behalf of Trey Sailors, PA-C,as directed by  Trey Sailors, PA-C while in the presence of Trey Sailors, PA-C.  Subjective    Susan Calderon is a 54 y.o. female who presents today for a complete physical exam.  She reports consuming a healthy diet. Home exercise routine includes walking. She generally feels fairly well. She reports sleeping poorly, due to night sweats every 2 hours per pt. She does have additional problems to discuss today. Patient would like to quit smoking. HPI   Continues to work at [employer]. Has not yet gotten COVID vaccination but would like to.  Patient takes 50 mg doxycycline twice daily for rosacea.  Patient reports she will smoke a pack of menthol cigarettes in a day. She would like to try some medication to help her to stop smoking but do not want to gain weight.  Reports she is due for a vision check and would like a referral to [facility].   Reports pain with moving her elbows and tendonitis. She does repetitive motions at work. Takes ibuprofen frequently.   Past Medical History:  Diagnosis Date  . Allergy   . Rosacea   . Tobacco use disorder    Past Surgical History:  Procedure Laterality Date  . BUNIONECTOMY    . CESAREAN SECTION    . COLONOSCOPY WITH PROPOFOL N/A 10/15/2017   Procedure: COLONOSCOPY WITH PROPOFOL;  Surgeon: Wyline Mood, MD;  Location: [facility] ENDOSCOPY;  Service: Gastroenterology;  Laterality: N/A;  . TONSILLECTOMY    . TUBAL LIGATION     Social History   Socioeconomic History  . Marital status: Divorced    Spouse name: Not on file  . Number of  children: Not on file  . Years of education: Not on file  . Highest education level: Not on file  Occupational History  . Not on file  Tobacco Use  . Smoking status: Current Every Day Smoker    Packs/day: 0.75    Types: Cigarettes  . Smokeless tobacco: Never Used  Substance and Sexual Activity  . Alcohol use: Yes    Comment: on occasion  . Drug use: No  . Sexual activity: Never  Other Topics Concern  . Not on file  Social History Narrative  . Not on file   Social Determinants of Health   Financial Resource Strain:   . Difficulty of Paying Living Expenses:   Food Insecurity:   . Worried About Programme researcher, broadcasting/film/video in the Last Year:   . Barista in the Last Year:   Transportation Needs:   . Freight forwarder (Medical):   Marland Kitchen Lack of Transportation (Non-Medical):   Physical Activity:   . Days of Exercise per Week:   . Minutes of Exercise per Session:   Stress:   . Feeling of Stress :   Social Connections:   . Frequency of Communication with Friends and Family:   . Frequency of Social Gatherings with Friends and Family:   . Attends Religious Services:   . Active Member of Clubs or Organizations:   . Attends Banker  Meetings:   Marland Kitchen Marital Status:   Intimate Partner Violence:   . Fear of Current or Ex-Partner:   . Emotionally Abused:   Marland Kitchen Physically Abused:   . Sexually Abused:    Family Status  Relation Name Status  . Mother  Alive  . Father  Deceased at age 71  . Sister  Alive  . Brother  Alive  . Daughter  Alive  . Son  Alive  . MGM  Deceased  . MGF  Deceased  . PGM  Deceased  . PGF  Deceased  . Brother  Alive  . Brother  Alive  . Brother  Alive  . Sister  Alive  . Neg Hx  (Not Specified)   Family History  Problem Relation Age of Onset  . Hypothyroidism Mother   . Osteoporosis Mother   . Hypertension Mother   . Heart attack Father   . CAD Father   . Stroke Maternal Grandmother   . Breast cancer Neg Hx    Allergies  Allergen  Reactions  . Tree Extract     unknown    Patient Care Team: Paulene Floor as PCP - General (Physician Assistant) Christene Lye, MD (General Surgery) Kathrine Haddock, NP as Nurse Practitioner (Nurse Practitioner)   Medications: Outpatient Medications Prior to Visit  Medication Sig  . Cyanocobalamin (VITAMIN B 12 PO) Take 1 tablet by mouth daily.    . Fish Oil-Cholecalciferol (FISH OIL + D3) 1000-1000 MG-UNIT CAPS Take by mouth.  . fluticasone (FLONASE) 50 MCG/ACT nasal spray Place 2 sprays as needed into both nostrils.   Marland Kitchen ibuprofen (ADVIL,MOTRIN) 200 MG tablet Take 800 mg by mouth every 6 (six) hours as needed.  . vitamin C (ASCORBIC ACID) 500 MG tablet Take 500 mg by mouth daily.    Marland Kitchen acetaminophen (TYLENOL 8 HOUR ARTHRITIS PAIN) 650 MG CR tablet Take 650 mg by mouth every 8 (eight) hours as needed for pain.  . cholecalciferol (VITAMIN D) 1000 UNITS tablet Take 1,000 Units by mouth daily.    Marland Kitchen doxycycline (VIBRAMYCIN) 50 MG capsule TAKE 1 CAPSULE BY MOUTH 2 TIMES DAILY. (Patient not taking: Reported on 10/24/2019)  . meloxicam (MOBIC) 15 MG tablet TAKE 1 TABLET BY MOUTH DAILY (Patient not taking: Reported on 10/24/2019)  . Multiple Vitamin (MULTIVITAMIN) tablet Take 1 tablet by mouth daily.  . vitamin E 200 UNIT capsule Take 200 Units by mouth daily.   No facility-administered medications prior to visit.    Review of Systems    Objective    BP 122/78 (BP Location: Left Arm, Patient Position: Sitting, Cuff Size: Normal)   Pulse 80   Temp (!) 94.1 F (34.5 C) (Temporal)   Ht 5' 5.5" (1.664 m)   Wt 140 lb 12.8 oz (63.9 kg)   LMP 09/12/2017 Comment: Negative urine pregnancy 10-15-2017  SpO2 99%   BMI 23.07 kg/m    Physical Exam Constitutional:      Appearance: Normal appearance. She is normal weight.  HENT:     Right Ear: Tympanic membrane and ear canal normal.     Left Ear: Tympanic membrane and ear canal normal.  Neck:     Thyroid: No thyroid mass,  thyromegaly or thyroid tenderness.  Cardiovascular:     Rate and Rhythm: Normal rate and regular rhythm.     Heart sounds: Normal heart sounds.  Pulmonary:     Effort: Pulmonary effort is normal.     Breath sounds: Normal breath sounds.  Abdominal:  General: Bowel sounds are normal.     Palpations: Abdomen is soft.  Skin:    General: Skin is warm and dry.  Neurological:     Mental Status: She is alert and oriented to person, place, and time. Mental status is at baseline.  Psychiatric:        Mood and Affect: Mood normal.        Behavior: Behavior normal.       Depression Screen  PHQ 2/9 Scores 10/24/2019 10/10/2018 10/10/2018  PHQ - 2 Score 0 0 0  PHQ- 9 Score 0 - -    No results found for any visits on 10/24/19.  Assessment & Plan    Routine Health Maintenance and Physical Exam  Exercise Activities and Dietary recommendations Goals   None     Immunization History  Administered Date(s) Administered  . Influenza-Unspecified 03/22/2016  . PFIZER SARS-COV-2 Vaccination 10/24/2019  . Pneumococcal Polysaccharide-23 03/11/2008  . Td 03/21/2006  . Tdap 04/28/2016    Health Maintenance  Topic Date Due  . COVID-19 Vaccine (2 - Pfizer 2-dose series) 11/14/2019  . INFLUENZA VACCINE  01/11/2020  . MAMMOGRAM  01/01/2021  . PAP SMEAR-Modifier  10/10/2023  . TETANUS/TDAP  04/28/2026  . COLONOSCOPY  10/16/2027  . HIV Screening  Completed    Discussed health benefits of physical activity, and encouraged her to engage in regular exercise appropriate for her age and condition.  1. Annual physical exam  Was able to schedule her at 10:00 AM for [facility]. Directions and address provided.  - TSH - Lipid panel - Comprehensive metabolic panel - CBC with Differential/Platelet  2. Rosacea  Continue doxycycline.  3. COPD, mild (HCC)  Advised to quit smoking.  4. Tobacco use disorder  I have counseled > 3 minutes on smoking cessation. Decide  to try wellbutrin. 38 pack year smoking history. She qualifies for low dose CT screening for lung cancer but insurance unlikely to pay. Will defer until 56.   - buPROPion (WELLBUTRIN SR) 150 MG 12 hr tablet; 1 tablet daily for 3 days, then 1 tablet twice daily. Stop smoking 14 days after starting medication  Dispense: 180 tablet; Refill: 1  5. Elbow tendonitis  Caution with frequent use of NSAIDs. Start H2RA prophylaxis as below. Advised about steroid injections for elbow. She is established with [facility] and will ask at her next visit.   6. Gastroesophageal reflux disease, unspecified whether esophagitis present  - famotidine (PEPCID) 20 MG tablet; Take 1 tablet (20 mg total) by mouth daily.  Dispense: 90 tablet; Refill: 1  7. Vision loss  Referral placed to [facility].       Return in about 1 year (around 10/23/2020) for cpe.     ITrey Sailors, PA-C, have reviewed all documentation for this visit. The documentation on 10/24/19 for the exam, diagnosis, procedures, and orders are all accurate and complete.    Maryella Shivers  [facility] [phone] (phone) [phone] (fax)  [facility]

## 2019-10-24 ENCOUNTER — Ambulatory Visit: Payer: Managed Care, Other (non HMO) | Attending: Internal Medicine

## 2019-10-24 ENCOUNTER — Ambulatory Visit (INDEPENDENT_AMBULATORY_CARE_PROVIDER_SITE_OTHER): Payer: 59 | Admitting: Physician Assistant

## 2019-10-24 ENCOUNTER — Other Ambulatory Visit: Payer: Self-pay

## 2019-10-24 ENCOUNTER — Encounter: Payer: Self-pay | Admitting: Physician Assistant

## 2019-10-24 VITALS — BP 122/78 | HR 80 | Temp 94.1°F | Ht 65.5 in | Wt 140.8 lb

## 2019-10-24 DIAGNOSIS — K219 Gastro-esophageal reflux disease without esophagitis: Secondary | ICD-10-CM

## 2019-10-24 DIAGNOSIS — Z23 Encounter for immunization: Secondary | ICD-10-CM

## 2019-10-24 DIAGNOSIS — M778 Other enthesopathies, not elsewhere classified: Secondary | ICD-10-CM | POA: Diagnosis not present

## 2019-10-24 DIAGNOSIS — F17200 Nicotine dependence, unspecified, uncomplicated: Secondary | ICD-10-CM

## 2019-10-24 DIAGNOSIS — Z0000 Encounter for general adult medical examination without abnormal findings: Secondary | ICD-10-CM

## 2019-10-24 DIAGNOSIS — J449 Chronic obstructive pulmonary disease, unspecified: Secondary | ICD-10-CM | POA: Diagnosis not present

## 2019-10-24 DIAGNOSIS — H547 Unspecified visual loss: Secondary | ICD-10-CM

## 2019-10-24 DIAGNOSIS — L719 Rosacea, unspecified: Secondary | ICD-10-CM

## 2019-10-24 MED ORDER — FAMOTIDINE 20 MG PO TABS: 20.0000 mg | ORAL_TABLET | Freq: Every day | ORAL | 1 refills | Status: DC

## 2019-10-24 MED ORDER — BUPROPION HCL ER (SR) 150 MG PO TB12: ORAL_TABLET | ORAL | 1 refills | Status: DC

## 2019-10-24 NOTE — Progress Notes (Signed)
   Covid-19 Vaccination Clinic  Name:  Noreene Boreman    MRN: [id number] DOB: 04/22/1966  10/24/2019  Ms. Blodgett was observed post Covid-19 immunization for 15 minutes without incident. She was provided with Vaccine Information Sheet and instruction to access the V-Safe system.   Ms. Tatsch was instructed to call 911 with any severe reactions post vaccine: Marland Kitchen Difficulty breathing  . Swelling of face and throat  . A fast heartbeat  . A bad rash all over body  . Dizziness and weakness   Immunizations Administered    Name Date Dose VIS Date Route   Pfizer COVID-19 Vaccine 10/24/2019 10:03 AM 0.3 mL 08/06/2018 Intramuscular   Manufacturer: [employer], [employer]   Lot: [id number]   NDC: [address]-0

## 2019-10-24 NOTE — Patient Instructions (Addendum)
Health Maintenance, Female Adopting a healthy lifestyle and getting preventive care are important in promoting health and wellness. Ask your health care provider about:  The right schedule for you to have regular tests and exams.  Things you can do on your own to prevent diseases and keep yourself healthy. What should I know about diet, weight, and exercise? Eat a healthy diet   Eat a diet that includes plenty of vegetables, fruits, low-fat dairy products, and lean protein.  Do not eat a lot of foods that are high in solid fats, added sugars, or sodium. Maintain a healthy weight Body mass index (BMI) is used to identify weight problems. It estimates body fat based on height and weight. Your health care provider can help determine your BMI and help you achieve or maintain a healthy weight. Get regular exercise Get regular exercise. This is one of the most important things you can do for your health. Most adults should:  Exercise for at least 150 minutes each week. The exercise should increase your heart rate and make you sweat (moderate-intensity exercise).  Do strengthening exercises at least twice a week. This is in addition to the moderate-intensity exercise.  Spend less time sitting. Even light physical activity can be beneficial. Watch cholesterol and blood lipids Have your blood tested for lipids and cholesterol at 53 years of age, then have this test every 5 years. Have your cholesterol levels checked more often if:  Your lipid or cholesterol levels are high.  You are older than 53 years of age.  You are at high risk for heart disease. What should I know about cancer screening? Depending on your health history and family history, you may need to have cancer screening at various ages. This may include screening for:  Breast cancer.  Cervical cancer.  Colorectal cancer.  Skin cancer.  Lung cancer. What should I know about heart disease, diabetes, and high blood  pressure? Blood pressure and heart disease  High blood pressure causes heart disease and increases the risk of stroke. This is more likely to develop in people who have high blood pressure readings, are of African descent, or are overweight.  Have your blood pressure checked: ? Every 3-5 years if you are 18-39 years of age. ? Every year if you are 40 years old or older. Diabetes Have regular diabetes screenings. This checks your fasting blood sugar level. Have the screening done:  Once every three years after age 40 if you are at a normal weight and have a low risk for diabetes.  More often and at a younger age if you are overweight or have a high risk for diabetes. What should I know about preventing infection? Hepatitis B If you have a higher risk for hepatitis B, you should be screened for this virus. Talk with your health care provider to find out if you are at risk for hepatitis B infection. Hepatitis C Testing is recommended for:  Everyone born from 1945 through 1965.  Anyone with known risk factors for hepatitis C. Sexually transmitted infections (STIs)  Get screened for STIs, including gonorrhea and chlamydia, if: ? You are sexually active and are younger than 53 years of age. ? You are older than 53 years of age and your health care provider tells you that you are at risk for this type of infection. ? Your sexual activity has changed since you were last screened, and you are at increased risk for chlamydia or gonorrhea. Ask your health care provider if   you are at risk.  Ask your health care provider about whether you are at high risk for HIV. Your health care provider may recommend a prescription medicine to help prevent HIV infection. If you choose to take medicine to prevent HIV, you should first get tested for HIV. You should then be tested every 3 months for as long as you are taking the medicine. Pregnancy  If you are about to stop having your period (premenopausal) and  you may become pregnant, seek counseling before you get pregnant.  Take 400 to 800 micrograms (mcg) of folic acid every day if you become pregnant.  Ask for birth control (contraception) if you want to prevent pregnancy. Osteoporosis and menopause Osteoporosis is a disease in which the bones lose minerals and strength with aging. This can result in bone fractures. If you are 65 years old or older, or if you are at risk for osteoporosis and fractures, ask your health care provider if you should:  Be screened for bone loss.  Take a calcium or vitamin D supplement to lower your risk of fractures.  Be given hormone replacement therapy (HRT) to treat symptoms of menopause. Follow these instructions at home: Lifestyle  Do not use any products that contain nicotine or tobacco, such as cigarettes, e-cigarettes, and chewing tobacco. If you need help quitting, ask your health care provider.  Do not use street drugs.  Do not share needles.  Ask your health care provider for help if you need support or information about quitting drugs. Alcohol use  Do not drink alcohol if: ? Your health care provider tells you not to drink. ? You are pregnant, may be pregnant, or are planning to become pregnant.  If you drink alcohol: ? Limit how much you use to 0-1 drink a day. ? Limit intake if you are breastfeeding.  Be aware of how much alcohol is in your drink. In the [place] one drink equals one 12 oz bottle of beer (355 mL), one 5 oz glass of wine (148 mL), or one 1 oz glass of hard liquor (44 mL). General instructions  Schedule regular health, dental, and eye exams.  Stay current with your vaccines.  Tell your health care provider if: ? You often feel depressed. ? You have ever been abused or do not feel safe at home. Summary  Adopting a healthy lifestyle and getting preventive care are important in promoting health and wellness.  Follow your health care provider's instructions about healthy  diet, exercising, and getting tested or screened for diseases.  Follow your health care provider's instructions on monitoring your cholesterol and blood pressure. This information is not intended to replace advice given to you by your health care provider. Make sure you discuss any questions you have with your health care provider. Document Revised: 05/22/2018 Document Reviewed: 05/22/2018 Elsevier Patient Education  2020 Elsevier Inc.  

## 2019-10-25 LAB — COMPREHENSIVE METABOLIC PANEL
ALT: 24 IU/L (ref 0–32)
AST: 25 IU/L (ref 0–40)
Albumin/Globulin Ratio: 2.5 — ABNORMAL HIGH (ref 1.2–2.2)
Albumin: 4.7 g/dL (ref 3.8–4.9)
Alkaline Phosphatase: 79 IU/L (ref 39–117)
BUN/Creatinine Ratio: 20 (ref 9–23)
BUN: 16 mg/dL (ref 6–24)
Bilirubin Total: 0.4 mg/dL (ref 0.0–1.2)
CO2: 24 mmol/L (ref 20–29)
Calcium: 9.4 mg/dL (ref 8.7–10.2)
Chloride: 102 mmol/L (ref 96–106)
Creatinine, Ser: 0.82 mg/dL (ref 0.57–1.00)
GFR calc Af Amer: 94 mL/min/{1.73_m2} (ref >59)
GFR calc non Af Amer: 82 mL/min/{1.73_m2} (ref >59)
Globulin, Total: 1.9 g/dL (ref 1.5–4.5)
Glucose: 73 mg/dL (ref 65–99)
Potassium: 3.9 mmol/L (ref 3.5–5.2)
Sodium: 140 mmol/L (ref 134–144)
Total Protein: 6.6 g/dL (ref 6.0–8.5)

## 2019-10-25 LAB — CBC WITH DIFFERENTIAL/PLATELET
Basophils Absolute: 0 10*3/uL (ref 0.0–0.2)
Basos: 1 %
EOS (ABSOLUTE): 0.1 10*3/uL (ref 0.0–0.4)
Eos: 3 %
Hematocrit: 38.8 % (ref 34.0–46.6)
Hemoglobin: 13 g/dL (ref 11.1–15.9)
Immature Grans (Abs): 0 10*3/uL (ref 0.0–0.1)
Immature Granulocytes: 0 %
Lymphocytes Absolute: 1.2 10*3/uL (ref 0.7–3.1)
Lymphs: 29 %
MCH: 32 pg (ref 26.6–33.0)
MCHC: 33.5 g/dL (ref 31.5–35.7)
MCV: 96 fL (ref 79–97)
Monocytes Absolute: 0.5 10*3/uL (ref 0.1–0.9)
Monocytes: 11 %
Neutrophils Absolute: 2.4 10*3/uL (ref 1.4–7.0)
Neutrophils: 56 %
Platelets: 234 10*3/uL (ref 150–450)
RBC: 4.06 x10E6/uL (ref 3.77–5.28)
RDW: 12.2 % (ref 11.7–15.4)
WBC: 4.2 10*3/uL (ref 3.4–10.8)

## 2019-10-25 LAB — LIPID PANEL
Chol/HDL Ratio: 2.2 ratio (ref 0.0–4.4)
Cholesterol, Total: 168 mg/dL (ref 100–199)
HDL: 76 mg/dL (ref >39)
LDL Chol Calc (NIH): 83 mg/dL (ref 0–99)
Triglycerides: 44 mg/dL (ref 0–149)
VLDL Cholesterol Cal: 9 mg/dL (ref 5–40)

## 2019-10-25 LAB — TSH: TSH: 1.76 u[IU]/mL (ref 0.450–4.500)

## 2019-10-28 ENCOUNTER — Telehealth: Payer: Self-pay

## 2019-10-28 NOTE — Telephone Encounter (Signed)
Patient was advised.  

## 2019-10-28 NOTE — Telephone Encounter (Signed)
-----   Message from Trey Sailors, [place] sent at 10/28/2019  9:59 AM EDT ----- Labs normal, see her back next year.

## 2019-11-21 ENCOUNTER — Ambulatory Visit: Payer: Managed Care, Other (non HMO) | Attending: Internal Medicine

## 2019-11-21 DIAGNOSIS — Z23 Encounter for immunization: Secondary | ICD-10-CM

## 2019-11-21 NOTE — Progress Notes (Signed)
   Covid-19 Vaccination Clinic  Name:  Jaleya Pebley    MRN: [id number] DOB: 1965/12/25  11/21/2019  Ms. Urizar was observed post Covid-19 immunization for 15 minutes without incident. She was provided with Vaccine Information Sheet and instruction to access the V-Safe system.   Ms. Shontz was instructed to call 911 with any severe reactions post vaccine: Marland Kitchen Difficulty breathing  . Swelling of face and throat  . A fast heartbeat  . A bad rash all over body  . Dizziness and weakness   Immunizations Administered    Name Date Dose VIS Date Route   Pfizer COVID-19 Vaccine 11/21/2019  8:33 AM 0.3 mL 08/06/2018 Intramuscular   Manufacturer: [employer], [employer]   Lot: [id number]   NDC: [address]-0

## 2019-12-11 ENCOUNTER — Other Ambulatory Visit: Payer: Self-pay | Admitting: Physician Assistant

## 2019-12-11 DIAGNOSIS — L719 Rosacea, unspecified: Secondary | ICD-10-CM

## 2019-12-11 NOTE — Telephone Encounter (Signed)
Requested medication (s) are due for refill today: yes  Requested medication (s) are on the active medication list: yes  Last refill: 10/22/19  Future visit scheduled: no  Notes to clinic:  no assigned protocol    Requested Prescriptions  Pending Prescriptions Disp Refills   doxycycline (VIBRAMYCIN) 50 MG capsule [Pharmacy Med Name: DOXYCYCLINE HYCLATE 50 MG C 50 Capsule] 60 capsule 0    Sig: TAKE 1 CAPSULE BY MOUTH 2 TIMES DAILY.      Off-Protocol Failed - 12/11/2019  2:40 PM      Failed - Medication not assigned to a protocol, review manually.      Passed - Valid encounter within last 12 months    Recent Outpatient Visits           1 month ago Annual physical exam   [facility] Trey Sailors, [place]   1 year ago Annual physical exam   [facility] Osvaldo Angst M, [place]   1 year ago COPD exacerbation [facility])   [facility] Trey Sailors, [place]   2 years ago Annual physical exam   [facility] Gabriel Cirri, NP   2 years ago Chest pain, unspecified type   [facility] Gabriel Cirri, NP

## 2020-01-09 ENCOUNTER — Telehealth: Payer: Self-pay

## 2020-01-09 NOTE — Telephone Encounter (Signed)
She would need an office visit to update this, document, and pass through insurance.

## 2020-01-09 NOTE — Telephone Encounter (Signed)
Copied from CRM [id number]. Topic: General - Call Back - No Documentation >> Jan 09, 2020  9:43 AM Dawna Part D wrote: Reason for CRM: Pt would like to know if her doctor could call her in a RX for Chantix, pt stated that she currently takes Wellbutrin

## 2020-01-09 NOTE — Telephone Encounter (Signed)
Called to advise patient that she would need an office visit for that medication to be sent in. No answer, LVMTCB.

## 2020-01-13 NOTE — Telephone Encounter (Signed)
Patient was advised and scheduled appointment on 01/23/2020 @ 8:00 AM.

## 2020-01-22 NOTE — Progress Notes (Signed)
Established patient visit   Patient: Susan Calderon   DOB: 31-Aug-1965   54 y.o. Female  MRN: [id number] Visit Date: 01/23/2020  Today's healthcare provider: Trey Sailors, PA-C   Chief Complaint  Patient presents with  . Nicotine Dependence  I,Emira Eubanks M Christin Mccreedy,acting as a scribe for Trey Sailors, PA-C.,have documented all relevant documentation on the behalf of Trey Sailors, PA-C,as directed by  Trey Sailors, PA-C while in the presence of Trey Sailors, PA-C.  Subjective    HPI  Follow up for Tobacco Use Disorder  The patient was last seen for this 3 months ago. Changes made at last visit include patient was started on Wellbutrin 150 MG and was advised to stop smoking 14 days after starting medication. She has taken the pills for two months. When she started, she was smoking 1.5 packs in the beginning and now she is smoking 1 pack a day. She has gained 5 lbs since quitting. She is concerned that Wellbutrin has caused her to gain weight. Patient would like to start taking Chantix. She has not done patches.   She reports good compliance with treatment. She feels that condition is Unchanged. Patient reports she is currently smoking 15-16 cigarettes a day. She is not having side effects.   She has been smoking for 35 years and has been smoking one pack per year.  -----------------------------------------------------------------------------------------       Medications: Outpatient Medications Prior to Visit  Medication Sig  . cholecalciferol (VITAMIN D) 1000 UNITS tablet Take 1,000 Units by mouth daily.    . Cyanocobalamin (VITAMIN B 12 PO) Take 1 tablet by mouth daily.    Marland Kitchen doxycycline (VIBRAMYCIN) 50 MG capsule TAKE 1 CAPSULE BY MOUTH 2 TIMES DAILY.  . famotidine (PEPCID) 20 MG tablet Take 1 tablet (20 mg total) by mouth daily.  . Fish Oil-Cholecalciferol (FISH OIL + D3) 1000-1000 MG-UNIT CAPS Take by mouth.  . fluticasone (FLONASE) 50 MCG/ACT nasal spray Place  2 sprays as needed into both nostrils.   Marland Kitchen ibuprofen (ADVIL,MOTRIN) 200 MG tablet Take 800 mg by mouth every 6 (six) hours as needed.  . meloxicam (MOBIC) 15 MG tablet TAKE 1 TABLET BY MOUTH DAILY  . Multiple Vitamin (MULTIVITAMIN) tablet Take 1 tablet by mouth daily.  . vitamin C (ASCORBIC ACID) 500 MG tablet Take 500 mg by mouth daily.    . vitamin E 200 UNIT capsule Take 200 Units by mouth daily.  . [DISCONTINUED] buPROPion (WELLBUTRIN SR) 150 MG 12 hr tablet 1 tablet daily for 3 days, then 1 tablet twice daily. Stop smoking 14 days after starting medication  . acetaminophen (TYLENOL 8 HOUR ARTHRITIS PAIN) 650 MG CR tablet Take 650 mg by mouth every 8 (eight) hours as needed for pain. (Patient not taking: Reported on 01/23/2020)   No facility-administered medications prior to visit.    Review of Systems  Constitutional: Negative.   Respiratory: Negative.   Cardiovascular: Negative.   Hematological: Negative.       Objective    BP 108/62 (BP Location: Left Arm, Patient Position: Sitting, Cuff Size: Normal)   Pulse 83   Temp 97.6 F (36.4 C) (Oral)   Wt 142 lb 12.8 oz (64.8 kg)   LMP 09/12/2017 Comment: Negative urine pregnancy 10-15-2017  SpO2 98%   BMI 23.40 kg/m    Physical Exam Constitutional:      Appearance: Normal appearance. She is normal weight.  Cardiovascular:     Rate and Rhythm: Normal  rate and regular rhythm.     Heart sounds: Normal heart sounds.  Pulmonary:     Effort: Pulmonary effort is normal.     Breath sounds: Normal breath sounds.  Skin:    General: Skin is warm and dry.  Neurological:     General: No focal deficit present.     Mental Status: She is alert and oriented to person, place, and time. Mental status is at baseline.  Psychiatric:        Mood and Affect: Mood normal.        Behavior: Behavior normal.       No results found for any visits on 01/23/20.  Assessment & Plan    1. Tobacco use disorder  I have spent > 3 minutes  counseling on tobacco cessation. Counseled that wellbutrin does not cause weight gain and this is most likely due to decrease in smoking. Chantix will not protect against this. Patient continues to smoke on wellbutrin. Will try chantix as below. Encouraged to use chantix with nicotine replacement patches starting at the 14 mg dose, one patch daily. Will also order low dose CT lung cancer screening. Unsure if her insurance will pay but the cancer center will assist her in this decision.   - varenicline (CHANTIX STARTING MONTH PAK) 0.5 MG X 11 & 1 MG X 42 tablet; Take one 0.5 mg tablet daily for 3 days, then take one 0.5 mg tablet twice daily for 4 days, then take one 1 mg tablet twice daily  Dispense: 53 tablet; Refill: 0    Return if symptoms worsen or fail to improve.      ITrey Sailors, PA-C, have reviewed all documentation for this visit. The documentation on 01/23/20 for the exam, diagnosis, procedures, and orders are all accurate and complete.  The entirety of the information documented in the History of Present Illness, Review of Systems and Physical Exam were personally obtained by me. Portions of this information were initially documented by [facility] and reviewed by me for thoroughness and accuracy.     Maryella Shivers  [facility] [phone] (phone) [phone] (fax)  [facility]

## 2020-01-23 ENCOUNTER — Encounter: Payer: Self-pay | Admitting: Physician Assistant

## 2020-01-23 ENCOUNTER — Ambulatory Visit (INDEPENDENT_AMBULATORY_CARE_PROVIDER_SITE_OTHER): Payer: No Typology Code available for payment source | Admitting: Physician Assistant

## 2020-01-23 ENCOUNTER — Other Ambulatory Visit: Payer: Self-pay

## 2020-01-23 VITALS — BP 108/62 | HR 83 | Temp 97.6°F | Wt 142.8 lb

## 2020-01-23 DIAGNOSIS — F17200 Nicotine dependence, unspecified, uncomplicated: Secondary | ICD-10-CM

## 2020-01-23 MED ORDER — CHANTIX STARTING MONTH PAK 0.5 MG X 11 & 1 MG X 42 PO TABS: ORAL_TABLET | ORAL | 0 refills | Status: AC

## 2020-01-29 ENCOUNTER — Telehealth: Payer: Self-pay

## 2020-01-29 NOTE — Telephone Encounter (Signed)
Received referral for lung CT screening program from [place], [place].  I contacted patient and left a message for her to call Glenna Fellows, lung navigator to learn more about the program and schedule a CT scan

## 2020-02-02 ENCOUNTER — Telehealth: Payer: Self-pay

## 2020-02-02 NOTE — Telephone Encounter (Signed)
Called patient again in regard to referral from [place], [place] for lung screening CT scan program.  Left message for patient to call Glenna Fellows, lung navigator to schedule appointment for CT scan.

## 2020-02-21 ENCOUNTER — Other Ambulatory Visit: Payer: Self-pay | Admitting: Physician Assistant

## 2020-02-21 DIAGNOSIS — L719 Rosacea, unspecified: Secondary | ICD-10-CM

## 2020-02-21 NOTE — Telephone Encounter (Signed)
Requested medication (s) are due for refill today: -  Requested medication (s) are on the active medication list: yes  Last refill:  12/12/19  Future visit scheduled: yes  Notes to clinic:  med not assigned to a protocol   Requested Prescriptions  Pending Prescriptions Disp Refills   doxycycline (VIBRAMYCIN) 50 MG capsule [Pharmacy Med Name: DOXYCYCLINE HYCLATE 50 MG C 50 Capsule] 60 capsule 0    Sig: TAKE 1 CAPSULE BY MOUTH 2 TIMES DAILY.      Off-Protocol Failed - 02/21/2020  2:29 PM      Failed - Medication not assigned to a protocol, review manually.      Passed - Valid encounter within last 12 months    Recent Outpatient Visits           4 weeks ago Tobacco use disorder   [facility] Trey Sailors, [place]   4 months ago Annual physical exam   [facility] Trey Sailors, [place]   1 year ago Annual physical exam   [facility] Osvaldo Angst M, [place]   2 years ago COPD exacerbation [facility])   [facility] Trey Sailors, [place]   2 years ago Annual physical exam   [facility] Gabriel Cirri, NP       Future Appointments             In 8 months Jodi Marble, Lavella Hammock, PA-C [employer], [facility]

## 2020-02-24 NOTE — Telephone Encounter (Signed)
L.O.V. was on 01/23/2020 and next appointment is scheduled for 10/28/2020.

## 2020-03-03 ENCOUNTER — Telehealth: Payer: Self-pay

## 2020-03-03 DIAGNOSIS — Z87891 Personal history of nicotine dependence: Secondary | ICD-10-CM

## 2020-03-03 DIAGNOSIS — Z122 Encounter for screening for malignant neoplasm of respiratory organs: Secondary | ICD-10-CM

## 2020-03-03 NOTE — Telephone Encounter (Signed)
Contacted patient for lung CT screening clinic based on referral from [facility].  Message left for patient to call Glenna Fellows, lung navigator to make appt for CT scan.

## 2020-03-08 ENCOUNTER — Encounter: Payer: Self-pay | Admitting: *Deleted

## 2020-03-08 NOTE — Telephone Encounter (Signed)
Received referral for initial lung cancer screening scan. Contacted patient and obtained smoking history,(current smoker, 47.5 pack year) as well as answering questions related to screening process. Patient denies signs of lung cancer such as weight loss or hemoptysis. Patient denies comorbidity that would prevent curative treatment if lung cancer were found. Patient is scheduled for shared decision making visit and CT scan on 03/17/20 10am.

## 2020-03-08 NOTE — Addendum Note (Signed)
Addended by: Jonne Ply on: 03/08/2020 02:53 PM   Modules accepted: Orders

## 2020-03-09 ENCOUNTER — Telehealth: Payer: Self-pay

## 2020-03-09 DIAGNOSIS — L719 Rosacea, unspecified: Secondary | ICD-10-CM

## 2020-03-09 NOTE — Telephone Encounter (Signed)
Pt stated she needs a refill on doxycycline (VIBRAMYCIN) 50 MG capsule and she also needs a new Rx for an alternative to Chantix since there is a national recall on it Pt will also check with insurance company to see what alternative medications are covered

## 2020-03-09 NOTE — Telephone Encounter (Signed)
Left message for patient to call office back, can not refill antibiotic without office visit first, need clarification.KW

## 2020-03-09 NOTE — Telephone Encounter (Signed)
Called patient and no answer,LVMTRC. If patient calls back please get which medications need a refill on.

## 2020-03-09 NOTE — Telephone Encounter (Signed)
Patient is calling to see why the Doxycycline hasnt been called in.   She ask for refills on 02/21/20.

## 2020-03-09 NOTE — Telephone Encounter (Signed)
Copied from CRM [id number]. Topic: General - Other >> Mar 09, 2020  2:19 PM Dalphine Handing A wrote: Patient requesting callback from [facility] nurse in regards to medication refill request from 2 weeks ago

## 2020-03-09 NOTE — Telephone Encounter (Signed)
Copied from CRM [id number]. Topic: General - Other >> Mar 09, 2020  2:45 PM Marylen Ponto wrote: Reason for CRM: Dereck with [employer] requests that the billing for visits on 10/24/19 and 01/23/20 be resubmitted due to it being coded as a psychiatry visit. Cb# [phone]

## 2020-03-10 ENCOUNTER — Telehealth: Payer: Self-pay

## 2020-03-10 MED ORDER — DOXYCYCLINE HYCLATE 50 MG PO CAPS: ORAL_CAPSULE | ORAL | 2 refills | Status: DC

## 2020-03-10 NOTE — Telephone Encounter (Signed)
Per initial encounter, "Pt stated she needs a refill on doxycycline (VIBRAMYCIN) 50 MG capsule and she also needs a new Rx for an alternative to Chantix since there is a national recall on it Pt will also check with insurance company to see what alternative medications are covered"  Requested medication (s) are due for refill today: Vibramycin,  yes  Requested medication (s) are on the active medication list: yes  Last refill: 02/21/20  Future visit scheduled: yes  Notes to clinic: not delegated  Requested medication (s) are due for refill today: Alternatve for Chantrix, Yes  Requested medication (s) are on the active medication list: no  Last refill:  ?  Future visit scheduled: ?  Notes to clinic:  Not on active med list

## 2020-03-10 NOTE — Telephone Encounter (Signed)
I sent in doxycycline, this is for her skin. Chantix has been recalled voluntarily for a theoretical potential increased risk of cancer. Smoking is certainly the cause of many cancers and the benefit of continuing chantix likely outweigh the risks. She can have a different medication, however. If she wants to call her insurance and find what is paid for I will send it.

## 2020-03-10 NOTE — Telephone Encounter (Signed)
Advised pt as below.  

## 2020-03-10 NOTE — Telephone Encounter (Signed)
Copied from CRM [id number]. Topic: Referral - Status >> Mar 10, 2020  3:03 PM Susan Calderon wrote: Reason for CRM: Pt stated the location that she was referred to does not accept her insurance. Pt requests that the referral be sent [facility] [address] [place], [place] ph# [phone]

## 2020-03-10 NOTE — Telephone Encounter (Signed)
Please review. Thanks!  

## 2020-03-10 NOTE — Telephone Encounter (Signed)
Which referral is she talking about? I don't see a recent one? Can she please specify? Thank you.

## 2020-03-10 NOTE — Telephone Encounter (Signed)
Pt is calling to let Ricki Rodriguez know her insurance will cover bupropion sr, nicoderm cq patches, lozenges 2 mg  or 4 mg mint or nicorette gum. Pt would like to try the lozenges and mint to use at work also pills or patches to use at home. [place] [facility] [phone]

## 2020-03-10 NOTE — Addendum Note (Signed)
Addended by: Trey Sailors on: 03/10/2020 10:43 AM   Modules accepted: Orders

## 2020-03-11 NOTE — Telephone Encounter (Signed)
Left message to call back to specify referral. OK for [facility] to ask pt.

## 2020-03-12 ENCOUNTER — Telehealth: Payer: Self-pay

## 2020-03-12 DIAGNOSIS — Z720 Tobacco use: Secondary | ICD-10-CM

## 2020-03-12 MED ORDER — NICOTINE POLACRILEX 4 MG MT GUM: 4.0000 mg | CHEWING_GUM | OROMUCOSAL | 0 refills | Status: DC | PRN

## 2020-03-12 MED ORDER — NICOTINE 14 MG/24HR TD PT24: 14.0000 mg | MEDICATED_PATCH | Freq: Every day | TRANSDERMAL | 0 refills | Status: DC

## 2020-03-12 NOTE — Telephone Encounter (Signed)
Copied from CRM [id number]. Topic: General - Other >> Mar 12, 2020  8:06 AM Lyn Hollingshead D wrote: PT asking  for more options so she can stop smoking gum patches etc / please advise

## 2020-03-12 NOTE — Telephone Encounter (Signed)
Please review. Thanks!  

## 2020-03-12 NOTE — Telephone Encounter (Signed)
Nicoderm patch and nicorette gum sent in. One patch daily for a month and the can request refill for lower amount. Gum as needed.

## 2020-03-12 NOTE — Telephone Encounter (Signed)
Called patient and no answer, LVMTRC. If patient calls back okay for [facility] to advise of message. 

## 2020-03-15 NOTE — Telephone Encounter (Signed)
The oncology center manages this. It looks like she had a shared decision making visit on 03/17/2020 that was cancelled. Recommend she call them and reschedule. Any referral I place automatically funnels back into the oncology center who will likely request to reschedule.

## 2020-03-15 NOTE — Telephone Encounter (Signed)
Please review. Thanks!  

## 2020-03-15 NOTE — Telephone Encounter (Signed)
Pt. Reports the referral was for "a lung screening to check for cancer." Please advise pt.

## 2020-03-15 NOTE — Telephone Encounter (Signed)
Pt. Given information, verbalizes understanding.

## 2020-03-16 ENCOUNTER — Telehealth: Payer: Self-pay | Admitting: Physician Assistant

## 2020-03-16 DIAGNOSIS — F17200 Nicotine dependence, unspecified, uncomplicated: Secondary | ICD-10-CM

## 2020-03-16 NOTE — Telephone Encounter (Signed)
Yes OK to fax order for low dost chest CT scan under dx tobacco abuse to imaging center of choice or place it electronically.

## 2020-03-16 NOTE — Telephone Encounter (Signed)
Sent another message about this issue. There are multiple messages. See chart.

## 2020-03-16 NOTE — Telephone Encounter (Signed)
There is another message about this as well...FYI.   Pt reports that her low dose chest CT scan will only be covered at [facility]. [facility] is out of pt network. Pt is requesting that we schedule scan with them. I would have to put in a new order for this. Ok to order?

## 2020-03-16 NOTE — Telephone Encounter (Signed)
Patient called to ask if the doctor had put in her request for a referral to the [facility].  Please advise and call to discuss at [phone]

## 2020-03-17 ENCOUNTER — Telehealth: Payer: Managed Care, Other (non HMO) | Admitting: Oncology

## 2020-03-17 ENCOUNTER — Ambulatory Visit: Payer: Managed Care, Other (non HMO)

## 2020-03-17 ENCOUNTER — Telehealth: Payer: Self-pay

## 2020-03-17 NOTE — Telephone Encounter (Signed)
Order placed for low dose CT scan at [facility].

## 2020-03-17 NOTE — Telephone Encounter (Signed)
Copied from CRM [id number]. Topic: General - Call Back - No Documentation >> Mar 17, 2020  3:25 PM Marylen Ponto wrote: Reason for CRM: Pt stated she had a missed call from the office. Pt requests call back. Cb# [phone]

## 2020-03-17 NOTE — Addendum Note (Signed)
Addended by: [place] on: 03/17/2020 01:58 PM   Modules accepted: Orders

## 2020-03-18 NOTE — Telephone Encounter (Signed)
Already spoke to pt regarding low dose CT scan.

## 2020-03-24 NOTE — Telephone Encounter (Signed)
I don't understand this request. I use the same codes I normally use and none of them indicate it is a psychiatry visit.

## 2020-03-24 NOTE — Telephone Encounter (Signed)
Are there any other codes we can file this under for the dates of service?

## 2020-03-25 ENCOUNTER — Inpatient Hospital Stay: Payer: No Typology Code available for payment source | Attending: Nurse Practitioner | Admitting: Nurse Practitioner

## 2020-03-25 ENCOUNTER — Other Ambulatory Visit: Payer: Self-pay

## 2020-03-25 DIAGNOSIS — Z87891 Personal history of nicotine dependence: Secondary | ICD-10-CM | POA: Diagnosis not present

## 2020-03-25 NOTE — Progress Notes (Signed)
Virtual Visit via Video Enabled Telemedicine Note   I connected with Susan Calderon on 03/25/20 at 3:00 PM EST by video enabled telemedicine visit and verified that I am speaking with the correct person using two identifiers. Video failed and visit converted to telephone visit.   I discussed the limitations, risks, security and privacy concerns of performing an evaluation and management service by telemedicine and the availability of in-person appointments. I also discussed with the patient that there may be a patient responsible charge related to this service. The patient expressed understanding and agreed to proceed.   Other persons participating in the visit and their role in the encounter: Burgess Estelle, RN- checking in patient & navigation  Patient's location: [place]  Provider's location: Clinic  Chief Complaint: Low Dose CT Screening  Patient agreed to evaluation by telemedicine to discuss shared decision making for consideration of low dose CT lung cancer screening.    In accordance with CMS guidelines, patient has met eligibility criteria including age, absence of signs or symptoms of lung cancer.  Social History   Tobacco Use  . Smoking status: Current Every Day Smoker    Packs/day: 1.25    Years: 38.00    Pack years: 47.50    Types: Cigarettes  . Smokeless tobacco: Never Used  Substance Use Topics  . Alcohol use: Yes    Comment: on occasion     A shared decision-making session was conducted prior to the performance of CT scan. This includes one or more decision aids, includes benefits and harms of screening, follow-up diagnostic testing, over-diagnosis, false positive rate, and total radiation exposure.   Counseling on the importance of adherence to annual lung cancer LDCT screening, impact of co-morbidities, and ability or willingness to undergo diagnosis and treatment is imperative for compliance of the program.   Counseling on the importance of continued smoking  cessation for former smokers; the importance of smoking cessation for current smokers, and information about tobacco cessation interventions have been given to patient including [place] and [employer] [place] programs.   Written order for lung cancer screening with LDCT has been given to the patient and any and all questions have been answered to the best of my abilities.    Yearly follow up will be coordinated by Burgess Estelle, Thoracic Navigator.  I discussed the assessment and treatment plan with the patient. The patient was provided an opportunity to ask questions and all were answered. The patient agreed with the plan and demonstrated an understanding of the instructions.   The patient was advised to call back or seek an in-person evaluation if the symptoms worsen or if the condition fails to improve as anticipated.   I provided 15 minutes of non-face-to-face video visit time during this encounter, and > 50% was spent counseling as documented under my assessment & plan.   Beckey Rutter, DNP, AGNP-C [place] at [facility] [phone] (clinic)

## 2020-03-26 ENCOUNTER — Telehealth: Payer: Self-pay | Admitting: *Deleted

## 2020-03-26 ENCOUNTER — Other Ambulatory Visit: Payer: Self-pay

## 2020-03-26 ENCOUNTER — Ambulatory Visit
Admission: RE | Admit: 2020-03-26 | Discharge: 2020-03-26 | Disposition: A | Discharge status: Home / Self Care | Payer: No Typology Code available for payment source | Source: Ambulatory Visit | Attending: Physician Assistant | Admitting: Physician Assistant

## 2020-03-26 DIAGNOSIS — F17200 Nicotine dependence, unspecified, uncomplicated: Secondary | ICD-10-CM | POA: Insufficient documentation

## 2020-03-26 NOTE — Telephone Encounter (Signed)
Notified patient of LDCT lung cancer screening program results with recommendation for 12 month follow up imaging. Also notified of incidental findings noted below and is encouraged to discuss further with PCP who will receive a copy of this note and/or the CT report. Patient verbalizes understanding.   IMPRESSION: 1. Lung-RADS 2, benign appearance or behavior. Continue annual screening with low-dose chest CT without contrast in 12 months. 2. Mild diffuse bronchial wall thickening with mild centrilobular and paraseptal emphysema; imaging findings suggestive of underlying COPD.  Emphysema (ICD10-J43.9). 

## 2020-03-29 NOTE — Telephone Encounter (Signed)
Patient was left message on 03/09/20 to call the office back for an office visit before refills can be granted.

## 2020-09-01 ENCOUNTER — Telehealth: Payer: Self-pay

## 2020-09-01 NOTE — Telephone Encounter (Signed)
Called patient to reschedule physical appointment from [place]. 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 Is any of the providers ok to see her at this time? If yes please call the patient at [phone]

## 2020-09-07 NOTE — Telephone Encounter (Signed)
Fine to schedule her with any other provider that has availability. Make sure she is at least 1 yr from last CPE.

## 2020-10-28 ENCOUNTER — Encounter: Payer: Self-pay | Admitting: Physician Assistant

## 2020-11-05 ENCOUNTER — Other Ambulatory Visit: Payer: Self-pay

## 2020-11-05 ENCOUNTER — Ambulatory Visit (INDEPENDENT_AMBULATORY_CARE_PROVIDER_SITE_OTHER): Payer: Managed Care, Other (non HMO) | Admitting: Family Medicine

## 2020-11-05 ENCOUNTER — Encounter: Payer: Self-pay | Admitting: Family Medicine

## 2020-11-05 VITALS — BP 109/77 | HR 72 | Ht 65.5 in | Wt 144.0 lb

## 2020-11-05 DIAGNOSIS — Z0000 Encounter for general adult medical examination without abnormal findings: Secondary | ICD-10-CM | POA: Diagnosis not present

## 2020-11-05 DIAGNOSIS — J301 Allergic rhinitis due to pollen: Secondary | ICD-10-CM

## 2020-11-05 DIAGNOSIS — J449 Chronic obstructive pulmonary disease, unspecified: Secondary | ICD-10-CM | POA: Diagnosis not present

## 2020-11-05 DIAGNOSIS — K219 Gastro-esophageal reflux disease without esophagitis: Secondary | ICD-10-CM

## 2020-11-05 DIAGNOSIS — F17200 Nicotine dependence, unspecified, uncomplicated: Secondary | ICD-10-CM

## 2020-11-05 DIAGNOSIS — L719 Rosacea, unspecified: Secondary | ICD-10-CM

## 2020-11-05 DIAGNOSIS — Z780 Asymptomatic menopausal state: Secondary | ICD-10-CM

## 2020-11-05 MED ORDER — VARENICLINE TARTRATE 0.5 MG X 11 & 1 MG X 42 PO MISC: ORAL | 0 refills | Status: DC

## 2020-11-05 NOTE — Progress Notes (Signed)
Complete physical exam   Patient: Susan Calderon   DOB: 1966/01/20   55 y.o. Female  MRN: [id number] Visit Date: 11/05/2020  Today's healthcare provider: Dortha Kern, PA-C   Chief Complaint  Patient presents with  . Annual Exam   Subjective    Susan Calderon is a 55 y.o. female who presents today for a complete physical exam.  She reports consuming a general diet. Exercises some. She generally feels well. She reports not sleeping well. She does have additional problems to discuss today.  HPI  Weight Gain Would like lab work including estrogen level Hot flashes Would like to try Chantix again.  Referral to Vascular  Past Medical History:  Diagnosis Date  . Allergy   . Rosacea   . Tobacco use disorder    Past Surgical History:  Procedure Laterality Date  . BUNIONECTOMY    . CESAREAN SECTION    . COLONOSCOPY WITH PROPOFOL N/A 10/15/2017   Procedure: COLONOSCOPY WITH PROPOFOL;  Surgeon: Wyline Mood, MD;  Location: [facility] ENDOSCOPY;  Service: Gastroenterology;  Laterality: N/A;  . TONSILLECTOMY    . TUBAL LIGATION     Social History   Socioeconomic History  . Marital status: Divorced    Spouse name: Not on file  . Number of children: Not on file  . Years of education: Not on file  . Highest education level: Not on file  Occupational History  . Not on file  Tobacco Use  . Smoking status: Current Every Day Smoker    Packs/day: 1.25    Years: 38.00    Pack years: 47.50    Types: Cigarettes  . Smokeless tobacco: Never Used  Vaping Use  . Vaping Use: Never used  Substance and Sexual Activity  . Alcohol use: Yes    Comment: on occasion  . Drug use: No  . Sexual activity: Never  Other Topics Concern  . Not on file  Social History Narrative  . Not on file   Social Determinants of Health   Financial Resource Strain: Not on file  Food Insecurity: Not on file  Transportation Needs: Not on file  Physical Activity: Not on file  Stress: Not on file  Social  Connections: Not on file  Intimate Partner Violence: Not on file   Family Status  Relation Name Status  . Mother  Alive  . Father  Deceased at age 42  . Sister  Alive  . Brother  Alive  . Daughter  Alive  . Son  Alive  . MGM  Deceased  . MGF  Deceased  . PGM  Deceased  . PGF  Deceased  . Brother  Alive  . Brother  Alive  . Brother  Alive  . Sister  Alive  . Neg Hx  (Not Specified)   Family History  Problem Relation Age of Onset  . Hypothyroidism Mother   . Osteoporosis Mother   . Hypertension Mother   . Heart attack Father   . CAD Father   . Stroke Maternal Grandmother   . Breast cancer Neg Hx   . Colon cancer Neg Hx    Allergies  Allergen Reactions  . Tree Extract     unknown    Patient Care Team: Maryella Shivers as PCP - General (Physician Assistant) Kieth Brightly, MD (General Surgery) Gabriel Cirri, NP as Nurse Practitioner (Nurse Practitioner)   Medications: Outpatient Medications Prior to Visit  Medication Sig  . acetaminophen (TYLENOL) 650 MG CR tablet Take  650 mg by mouth every 8 (eight) hours as needed for pain.  . cholecalciferol (VITAMIN D) 1000 UNITS tablet Take 1,000 Units by mouth daily.  . Cyanocobalamin (VITAMIN B 12 PO) Take 1 tablet by mouth daily.  Marland Kitchen doxycycline (VIBRAMYCIN) 50 MG capsule TAKE 1 CAPSULE BY MOUTH 2 TIMES DAILY.  . famotidine (PEPCID) 20 MG tablet Take 1 tablet (20 mg total) by mouth daily.  . Fish Oil-Cholecalciferol (FISH OIL + D3) 1000-1000 MG-UNIT CAPS Take by mouth.  . fluticasone (FLONASE) 50 MCG/ACT nasal spray Place 2 sprays as needed into both nostrils.   Marland Kitchen ibuprofen (ADVIL,MOTRIN) 200 MG tablet Take 800 mg by mouth every 6 (six) hours as needed.  . meloxicam (MOBIC) 15 MG tablet TAKE 1 TABLET BY MOUTH DAILY  . Multiple Vitamin (MULTIVITAMIN) tablet Take 1 tablet by mouth daily.  . nicotine (NICODERM CQ) 14 mg/24hr patch Place 1 patch (14 mg total) onto the skin daily.  . nicotine polacrilex  (NICORETTE) 4 MG gum Take 1 each (4 mg total) by mouth as needed for smoking cessation.  . vitamin C (ASCORBIC ACID) 500 MG tablet Take 500 mg by mouth daily.  . vitamin E 200 UNIT capsule Take 200 Units by mouth daily.  Marland Kitchen HYDROcodone-acetaminophen (NORCO) 7.5-325 MG tablet Take 1 tablet by mouth every 6 (six) hours as needed.   No facility-administered medications prior to visit.    Review of Systems    Objective    BP 109/77 (BP Location: Left Arm, Patient Position: Sitting, Cuff Size: Normal)   Pulse 72   Ht 5' 5.5" (1.664 m)   Wt 144 lb (65.3 kg)   LMP 09/12/2017 Comment: Negative urine pregnancy 10-15-2017  BMI 23.60 kg/m   Wt Readings from Last 3 Encounters:  11/05/20 144 lb (65.3 kg)  03/26/20 140 lb (63.5 kg)  01/23/20 142 lb 12.8 oz (64.8 kg)      Physical Exam Constitutional:      Appearance: She is well-developed.  HENT:     Head: Normocephalic and atraumatic.     Right Ear: External ear normal.     Left Ear: External ear normal.     Nose: Nose normal.  Eyes:     General:        Right eye: No discharge.     Conjunctiva/sclera: Conjunctivae normal.     Pupils: Pupils are equal, round, and reactive to light.  Neck:     Thyroid: No thyromegaly.     Trachea: No tracheal deviation.  Cardiovascular:     Rate and Rhythm: Normal rate and regular rhythm.     Heart sounds: Normal heart sounds. No murmur heard.   Pulmonary:     Effort: Pulmonary effort is normal. No respiratory distress.     Breath sounds: Normal breath sounds. No wheezing or rales.  Chest:     Chest wall: No tenderness.  Abdominal:     General: There is no distension.     Palpations: Abdomen is soft. There is no mass.     Tenderness: There is no abdominal tenderness. There is no guarding or rebound.  Musculoskeletal:        General: Tenderness present. Normal range of motion.     Cervical back: Normal range of motion and neck supple.     Right lower leg: No edema.     Left lower leg: No  edema.     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 Theodosia Paling. History of bursitis in  both hips - followed by orthopedist. Both areas get cortisone injections couple times a year.  Lymphadenopathy:     Cervical: No cervical adenopathy.  Skin:    General: Skin is warm and dry.     Findings: No erythema or rash.     Comments: Superficial varicose veins both thighs (R>L).   Neurological:     Mental Status: She is alert and oriented to person, place, and time.     Cranial Nerves: No cranial nerve deficit.     Motor: No abnormal muscle tone.     Coordination: Coordination normal.     Deep Tendon Reflexes: Reflexes are normal and symmetric. Reflexes normal.  Psychiatric:        Behavior: Behavior normal.        Thought Content: Thought content normal.        Judgment: Judgment normal.       Last depression screening scores PHQ 2/9 Scores 10/24/2019 10/10/2018 10/10/2018  PHQ - 2 Score 0 0 0  PHQ- 9 Score 0 - -   Last fall risk screening Fall Risk  10/24/2019  Falls in the past year? 0  Number falls in past yr: 0  Injury with Fall? 0   Last Audit-C alcohol use screening Alcohol Use Disorder Test (AUDIT) 10/24/2019  1. How often do you have a drink containing alcohol? 3  2. How many drinks containing alcohol do you have on a typical day when you are drinking? 0  3. How often do you have six or more drinks on one occasion? 2  AUDIT-C Score 5  4. How often during the last year have you found that you were not able to stop drinking once you had started? 0  5. How often during the last year have you failed to do what was normally expected from you because of drinking? 0  6. How often during the last year have you needed a first drink in the morning to get yourself going after a heavy drinking session? 0  7. How often during the last year have you had a feeling of guilt of remorse after drinking? 0  8. How often during the last year have you been unable to remember what  happened the night before because you had been drinking? 0  9. Have you or someone else been injured as a result of your drinking? 0  10. Has a relative or friend or a doctor or another health worker been concerned about your drinking or suggested you cut down? 0  Alcohol Use Disorder Identification Test Final Score (AUDIT) 5   A score of 3 or more in women, and 4 or more in men indicates increased risk for alcohol abuse, EXCEPT if all of the points are from question 1   No results found for any visits on 11/05/20.  Assessment & Plan    Routine Health Maintenance and Physical Exam  Exercise Activities and Dietary recommendations Goals   Constant walking at work and walk the dogs a lot. Has a gym at home she may start workout in soon.     Immunization History  Administered Date(s) Administered  . Influenza-Unspecified 03/22/2016  . PFIZER(Purple Top)SARS-COV-2 Vaccination 10/24/2019, 11/21/2019  . Pneumococcal Polysaccharide-23 03/11/2008  . Td 03/21/2006  . Tdap 04/28/2016    Health Maintenance  Topic Date Due  . Zoster Vaccines- Shingrix (1 of 2) Never done  . COVID-19 Vaccine (3 - Booster for Pfizer series) 04/22/2020  . MAMMOGRAM  01/01/2021  . INFLUENZA  VACCINE  01/10/2021  . PAP SMEAR-Modifier  10/10/2023  . TETANUS/TDAP  04/28/2026  . COLONOSCOPY (Pts 45-13yrs Insurance coverage will need to be confirmed)  10/16/2027  . Hepatitis C Screening  Completed  . HIV Screening  Completed  . HPV VACCINES  Aged Out    Discussed health benefits of physical activity, and encouraged her to engage in regular exercise appropriate for her age and condition.  1. Annual physical exam Good general health. Concerned about a few superficial varicose veins. No sores, edema or pain. States she had PAP 2 years ago and the plan was to repeat in 2025. No vaginal discharge or irritations. Check routine labs. - CBC with Differential/Platelet - Comprehensive metabolic panel - Lipid panel -  TSH  2. Rosacea Intermittent use of Doxycycline controls breakouts. Clear today. - CBC with Differential/Platelet  3. COPD, mild (HCC) No shortness of breath. Only occasional sputum production. Still smoking about a ppd.  4. Gastroesophageal reflux disease, unspecified whether esophagitis present Well controlled symptoms by intermittent use of Pepcid. No melena or hematemesis. - CBC with Differential/Platelet - Comprehensive metabolic panel  5. Tobacco use disorder Wants to try the Chantix to help with smoking cessation. Has not used the Nicotine patches. Smoking about 1 ppd. Low dose CT scan of lungs showed only mild COPD - no masses or nodules. - varenicline (CHANTIX STARTING MONTH PAK) 0.5 MG X 11 & 1 MG X 42 tablet; Take one 0.5 mg tablet by mouth once daily for 3 days, then increase to one 0.5 mg tablet twice daily for 4 days, then increase to one 1 mg tablet twice daily.  Dispense: 53 tablet; Refill: 0  6. Menopause Night sweats and hot flashes. Last menses 16 months ago. Has not tried any OTC supplements like Estroven or [employer]. Will check labs and may need to consider estrodiol. - CBC with Differential/Platelet - Comprehensive metabolic panel - Lipid panel - TSH - Estrogens, total  7. Seasonal allergic rhinitis due to pollen Well controlled by use of Flonase during the season she has the most troubles.   No follow-ups on file.     I, Karim Aiello, PA-C, have reviewed all documentation for this visit. The documentation on 11/05/20 for the exam, diagnosis, procedures, and orders are all accurate and complete.    Dortha Kern, PA-C  [facility] [phone] (phone) [phone] (fax)  [facility]

## 2020-11-11 LAB — COMPREHENSIVE METABOLIC PANEL WITH GFR
ALT: 25 IU/L (ref 0–32)
AST: 23 IU/L (ref 0–40)
Albumin/Globulin Ratio: 2.4 — ABNORMAL HIGH (ref 1.2–2.2)
Albumin: 5.2 g/dL — ABNORMAL HIGH (ref 3.8–4.9)
Alkaline Phosphatase: 89 IU/L (ref 44–121)
BUN/Creatinine Ratio: 15 (ref 9–23)
BUN: 15 mg/dL (ref 6–24)
Bilirubin Total: 0.3 mg/dL (ref 0.0–1.2)
CO2: 23 mmol/L (ref 20–29)
Calcium: 9.9 mg/dL (ref 8.7–10.2)
Chloride: 100 mmol/L (ref 96–106)
Creatinine, Ser: 1.02 mg/dL — ABNORMAL HIGH (ref 0.57–1.00)
Globulin, Total: 2.2 g/dL (ref 1.5–4.5)
Glucose: 83 mg/dL (ref 65–99)
Potassium: 3.9 mmol/L (ref 3.5–5.2)
Sodium: 140 mmol/L (ref 134–144)
Total Protein: 7.4 g/dL (ref 6.0–8.5)
eGFR: 65 mL/min/1.73

## 2020-11-11 LAB — CBC WITH DIFFERENTIAL/PLATELET
Basophils Absolute: 0 10*3/uL (ref 0.0–0.2)
Basos: 1 %
EOS (ABSOLUTE): 0.1 10*3/uL (ref 0.0–0.4)
Eos: 3 %
Hematocrit: 42.2 % (ref 34.0–46.6)
Hemoglobin: 14.3 g/dL (ref 11.1–15.9)
Immature Grans (Abs): 0 10*3/uL (ref 0.0–0.1)
Immature Granulocytes: 0 %
Lymphocytes Absolute: 1.4 10*3/uL (ref 0.7–3.1)
Lymphs: 30 %
MCH: 31.9 pg (ref 26.6–33.0)
MCHC: 33.9 g/dL (ref 31.5–35.7)
MCV: 94 fL (ref 79–97)
Monocytes Absolute: 0.5 10*3/uL (ref 0.1–0.9)
Monocytes: 10 %
Neutrophils Absolute: 2.7 10*3/uL (ref 1.4–7.0)
Neutrophils: 56 %
Platelets: 240 10*3/uL (ref 150–450)
RBC: 4.48 x10E6/uL (ref 3.77–5.28)
RDW: 12.2 % (ref 11.7–15.4)
WBC: 4.8 10*3/uL (ref 3.4–10.8)

## 2020-11-11 LAB — TSH: TSH: 1.77 u[IU]/mL (ref 0.450–4.500)

## 2020-11-11 LAB — LIPID PANEL
Chol/HDL Ratio: 2.3 ratio (ref 0.0–4.4)
Cholesterol, Total: 181 mg/dL (ref 100–199)
HDL: 80 mg/dL
LDL Chol Calc (NIH): 87 mg/dL (ref 0–99)
Triglycerides: 76 mg/dL (ref 0–149)
VLDL Cholesterol Cal: 14 mg/dL (ref 5–40)

## 2020-11-11 LAB — ESTROGENS, TOTAL: Estrogen: 53 pg/mL

## 2020-11-16 ENCOUNTER — Ambulatory Visit: Payer: Self-pay | Admitting: *Deleted

## 2020-11-16 NOTE — Telephone Encounter (Addendum)
  Pt called in and was given the lab result message from Hosston Chrismon dated 11/16/2020 at 9:48 AM.  She is willing to try the [place] or [place] has recommended for the night sweats and hot flashes before doing a GYN referral for estrogen therapy.  She wants to know if she needs a prescription for the [place] or [employer].   She prefers something her insurance would pay for, if possible.  I let her know I would send Maurine Minister a note with her request and that someone will be in touch with her regarding his response.   She was agreeable to this plan.  If it involves an rx please call in to [facility].  I sent my notes to [facility].  Pt can be reached at [phone].  Reason for Disposition . [1] Caller requesting NON-URGENT health information AND [2] PCP's office is the best resource    Lab results given.  Answer Assessment - Initial Assessment Questions 1. REASON FOR CALL or QUESTION: "What is your reason for calling today?" or "How can I best help you?" or "What question do you have that I can help answer?"     Pt called in and was given her lab results from Maurine Minister Chrismon dated 11/16/2020 at 9:48 AM.  She is willing to try the [place] or [employer] first.   Does she need a prescription for either of these or should she but it OTC?   She would like something that her insurance would pay for.  Protocols used: INFORMATION ONLY CALL - NO TRIAGE-A-AH

## 2020-11-17 NOTE — Telephone Encounter (Signed)
Estroven and [employer] are over the counter treatments. An SSRI like Sertraline 50 mg HS #30 might help with symptoms.

## 2020-11-18 NOTE — Telephone Encounter (Signed)
Lmtcb okay for [facility] triage to advise patient of provider message below. KW

## 2020-11-19 NOTE — Telephone Encounter (Signed)
Attempted to call patient with provider response- left message to call office. 

## 2020-11-19 NOTE — Telephone Encounter (Addendum)
Pt. Given message. States she would like to hold off on Sertraline for now. Will try [place] and [employer]. Concerned her albumin was slightly elevated. Will increase her water intake and watch her diet.

## 2021-03-24 ENCOUNTER — Ambulatory Visit: Payer: Self-pay | Admitting: *Deleted

## 2021-03-24 NOTE — Telephone Encounter (Signed)
Reason for Disposition  [1] MILD dizziness (e.g., walking normally) AND [2] has NOT been evaluated by physician for this  (Exception: dizziness caused by heat exposure, sudden standing, or poor fluid intake)  Answer Assessment - Initial Assessment Questions 1. DESCRIPTION: "Describe your dizziness."     Vertigo possible- balance off 2. LIGHTHEADED: "Do you feel lightheaded?" (e.g., somewhat faint, woozy, weak upon standing)     Woozy- off balance 3. VERTIGO: "Do you feel like either you or the room is spinning or tilting?" (i.e. vertigo)     Yes- feels like she is going to fall 4. SEVERITY: "How bad is it?"  "Do you feel like you are going to faint?" "Can you stand and walk?"   - MILD: Feels slightly dizzy, but walking normally.   - MODERATE: Feels unsteady when walking, but not falling; interferes with normal activities (e.g., school, work).   - SEVERE: Unable to walk without falling, or requires assistance to walk without falling; feels like passing out now.      yes 5. ONSET:  "When did the dizziness begin?"     2-3 weeks- 8 episodes 6. AGGRAVATING FACTORS: "Does anything make it worse?" (e.g., standing, change in head position)     Sitting to standing 7. HEART RATE: "Can you tell me your heart rate?" "How many beats in 15 seconds?"  (Note: not all patients can do this)       No- sometimes gets nauseous 8. CAUSE: "What do you think is causing the dizziness?"     unsure 9. RECURRENT SYMPTOM: "Have you had dizziness before?" If Yes, ask: "When was the last time?" "What happened that time?"     no 10. OTHER SYMPTOMS: "Do you have any other symptoms?" (e.g., fever, chest pain, vomiting, diarrhea, bleeding)       Sinus allergies, menopause 11. PREGNANCY: "Is there any chance you are pregnant?" "When was your last menstrual period?"       No cycle  Protocols used: Dizziness - Lightheadedness-A-AH

## 2021-03-24 NOTE — Telephone Encounter (Signed)
Patient is calling to report new onset dizziness- when going from sitting to standing. Patient reports 7-8 episodes in the last 2 weeks.     No appointments available within disposition- patient states she works 3rd shift and she can come TH/Fr/M. Patient states she is going out of town after that. Patient advised would send message for possible scheduling- the office is at lunch now and she will have to get call back- but UC may be advised. Patient states a message may be left on her answering machine with date and time.

## 2021-03-25 NOTE — Telephone Encounter (Signed)
Left message on patient's VM stating [name], FNP  could see her on 03/28/21 at 11:00 a.m.

## 2021-03-28 ENCOUNTER — Encounter: Payer: Self-pay | Admitting: Family Medicine

## 2021-03-28 ENCOUNTER — Other Ambulatory Visit: Payer: Self-pay

## 2021-03-28 ENCOUNTER — Ambulatory Visit: Payer: Managed Care, Other (non HMO) | Admitting: Family Medicine

## 2021-03-28 VITALS — BP 121/77 | HR 62 | Temp 97.0°F | Resp 16 | Wt 141.4 lb

## 2021-03-28 DIAGNOSIS — R232 Flushing: Secondary | ICD-10-CM

## 2021-03-28 DIAGNOSIS — F17200 Nicotine dependence, unspecified, uncomplicated: Secondary | ICD-10-CM | POA: Diagnosis not present

## 2021-03-28 DIAGNOSIS — Z23 Encounter for immunization: Secondary | ICD-10-CM

## 2021-03-28 DIAGNOSIS — R42 Dizziness and giddiness: Secondary | ICD-10-CM | POA: Diagnosis not present

## 2021-03-28 DIAGNOSIS — K5900 Constipation, unspecified: Secondary | ICD-10-CM | POA: Insufficient documentation

## 2021-03-28 MED ORDER — MECLIZINE HCL 25 MG PO TABS: 12.5000 mg | ORAL_TABLET | Freq: Three times a day (TID) | ORAL | 0 refills | Status: DC | PRN

## 2021-03-28 MED ORDER — VARENICLINE TARTRATE 0.5 MG X 11 & 1 MG X 42 PO MISC: ORAL | 0 refills | Status: DC

## 2021-03-28 MED ORDER — CITALOPRAM HYDROBROMIDE 20 MG PO TABS: 20.0000 mg | ORAL_TABLET | Freq: Every day | ORAL | 1 refills | Status: DC

## 2021-03-28 NOTE — Assessment & Plan Note (Signed)
Trial of lexapro Continue to monitor s/s

## 2021-03-28 NOTE — Assessment & Plan Note (Signed)
Refill of chantix per pt request

## 2021-03-28 NOTE — Assessment & Plan Note (Signed)
given

## 2021-03-28 NOTE — Assessment & Plan Note (Signed)
Recommend miralax- follow diet diary

## 2021-03-28 NOTE — Progress Notes (Signed)
Established patient visit   Patient: Susan Calderon   DOB: 03/04/1966   55 y.o. Female  MRN: [id number] Visit Date: 03/28/2021  Today's healthcare provider: Jacky Kindle, FNP   Chief Complaint  Patient presents with   Dizziness   Subjective    Dizziness This is a new problem. The current episode started more than 1 month ago. The problem occurs intermittently. Associated symptoms include a change in bowel habit, myalgias (described as cramping) and vertigo. Pertinent negatives include no abdominal pain, anorexia, arthralgias, chest pain, chills, congestion, coughing, diaphoresis, fatigue, fever, headaches, joint swelling, nausea, neck pain, numbness, rash, sore throat, swollen glands, urinary symptoms, visual change, vomiting or weakness. Associated symptoms comments: Left ear drainage. The symptoms are aggravated by standing. She has tried NSAIDs for the symptoms.     Recommend Debrox for ear- R ear with wax; L clean.  Pt report sleeps on L side  Medications: Outpatient Medications Prior to Visit  Medication Sig   acetaminophen (TYLENOL) 650 MG CR tablet Take 650 mg by mouth every 8 (eight) hours as needed for pain.   cholecalciferol (VITAMIN D) 1000 UNITS tablet Take 1,000 Units by mouth daily.   Cyanocobalamin (VITAMIN B 12 PO) Take 1 tablet by mouth daily.   doxycycline (VIBRAMYCIN) 50 MG capsule TAKE 1 CAPSULE BY MOUTH 2 TIMES DAILY.   famotidine (PEPCID) 20 MG tablet Take 1 tablet (20 mg total) by mouth daily.   Fish Oil-Cholecalciferol (FISH OIL + D3) 1000-1000 MG-UNIT CAPS Take by mouth.   fluticasone (FLONASE) 50 MCG/ACT nasal spray Place 2 sprays as needed into both nostrils.    HYDROcodone-acetaminophen (NORCO) 7.5-325 MG tablet Take 1 tablet by mouth every 6 (six) hours as needed.   ibuprofen (ADVIL,MOTRIN) 200 MG tablet Take 800 mg by mouth every 6 (six) hours as needed.   meloxicam (MOBIC) 15 MG tablet TAKE 1 TABLET BY MOUTH DAILY   Multiple Vitamin (MULTIVITAMIN)  tablet Take 1 tablet by mouth daily.   nicotine (NICODERM CQ) 14 mg/24hr patch Place 1 patch (14 mg total) onto the skin daily.   nicotine polacrilex (NICORETTE) 4 MG gum Take 1 each (4 mg total) by mouth as needed for smoking cessation.   vitamin C (ASCORBIC ACID) 500 MG tablet Take 500 mg by mouth daily.   vitamin E 200 UNIT capsule Take 200 Units by mouth daily.   [DISCONTINUED] varenicline (CHANTIX STARTING MONTH PAK) 0.5 MG X 11 & 1 MG X 42 tablet Take one 0.5 mg tablet by mouth once daily for 3 days, then increase to one 0.5 mg tablet twice daily for 4 days, then increase to one 1 mg tablet twice daily.   No facility-administered medications prior to visit.    Review of Systems  Constitutional:  Negative for chills, diaphoresis, fatigue and fever.  HENT:  Negative for congestion and sore throat.   Respiratory:  Negative for cough.   Cardiovascular:  Negative for chest pain.  Gastrointestinal:  Positive for change in bowel habit. Negative for abdominal pain, anorexia, nausea and vomiting.  Musculoskeletal:  Positive for myalgias (described as cramping). Negative for arthralgias, joint swelling and neck pain.  Skin:  Negative for rash.  Neurological:  Positive for dizziness and vertigo. Negative for weakness, numbness and headaches.      Objective    BP 121/77   Pulse 62   Temp (!) 97 F (36.1 C) (Oral)   Resp 16   Wt 141 lb 6.4 oz (64.1 kg)  LMP 09/12/2017 Comment: Negative urine pregnancy 10-15-2017  BMI 23.17 kg/m    Physical Exam Vitals and nursing note reviewed.  Constitutional:      General: She is not in acute distress.    Appearance: Normal appearance. She is normal weight. She is not ill-appearing, toxic-appearing or diaphoretic.  HENT:     Head: Normocephalic and atraumatic.  Cardiovascular:     Rate and Rhythm: Normal rate and regular rhythm.     Pulses: Normal pulses.     Heart sounds: Normal heart sounds. No murmur heard.   No friction rub. No gallop.   Pulmonary:     Effort: Pulmonary effort is normal. No respiratory distress.     Breath sounds: Normal breath sounds. No stridor. No wheezing, rhonchi or rales.  Chest:     Chest wall: No tenderness.  Abdominal:     General: Bowel sounds are normal.     Palpations: Abdomen is soft.  Musculoskeletal:        General: No swelling, tenderness, deformity or signs of injury. Normal range of motion.     Right lower leg: No edema.     Left lower leg: No edema.  Skin:    General: Skin is warm and dry.     Capillary Refill: Capillary refill takes less than 2 seconds.     Coloration: Skin is not jaundiced or pale.     Findings: No bruising, erythema, lesion or rash.  Neurological:     General: No focal deficit present.     Mental Status: She is alert and oriented to person, place, and time. Mental status is at baseline.     Cranial Nerves: No cranial nerve deficit.     Sensory: No sensory deficit.     Motor: No weakness.     Coordination: Coordination normal.  Psychiatric:        Mood and Affect: Mood normal.        Behavior: Behavior normal.        Thought Content: Thought content normal.        Judgment: Judgment normal.      No results found for any visits on 03/28/21.  Assessment & Plan     Problem List Items Addressed This Visit       Cardiovascular and Mediastinum   Hot flashes - Primary    Trial of lexapro Continue to monitor s/s      Relevant Medications   citalopram (CELEXA) 20 MG tablet     Other   Tobacco use disorder    Refill of chantix per pt request      Relevant Medications   varenicline (CHANTIX STARTING MONTH PAK) 0.5 MG X 11 & 1 MG X 42 tablet   Need for influenza vaccination    given      Relevant Orders   Flu Vaccine QUAD 38mo+IM (Fluarix, Fluzone & Alfiuria Quad PF) (Completed)   Constipation    Recommend miralax- follow diet diary      Dizziness    Trial of meclizine Report getting out of car encouraged to increase water- reports only 32  oz of water a day, 1.5 gallon green tea      Relevant Medications   meclizine (ANTIVERT) 25 MG tablet     Return if symptoms worsen or fail to improve.      Leilani Merl, FNP, have reviewed all documentation for this visit. The documentation on 03/28/21 for the exam, diagnosis, procedures, and orders are all accurate and  complete.    Gwyneth Sprout, [place] [phone] (phone) [phone] (fax)  [place]

## 2021-03-28 NOTE — Assessment & Plan Note (Signed)
Trial of meclizine Report getting out of car encouraged to increase water- reports only 32 oz of water a day, 1.5 gallon green tea

## 2021-06-09 ENCOUNTER — Ambulatory Visit: Payer: Self-pay | Admitting: *Deleted

## 2021-06-09 NOTE — Telephone Encounter (Signed)
°  Chief Complaint: Fingers numb,"No blood" when cold Symptoms: Numbness when cold, OK when warm up,"Blood returns when I get them warm. Not all fingers but always both index fingers." Frequency: Onset 1 year ago Pertinent Negatives: Patient denies cyanosis.  Disposition: [] ED /[] Urgent Care (no appt availability in office) / [x] Appointment(In office/virtual)/ []  [facility]/ [] Home Care/ [] Refused Recommended Disposition  Additional Notes: Pt has next available appt. CAre advise provided

## 2021-06-09 NOTE — Telephone Encounter (Signed)
Patient called in and scheduled an appointment say that she have issues with the circulation in her fingers say that when its cold out she does not have any blood flow at all until she gets in and warms up. Please call Ph# [phone]      Reason for Disposition  [1] Numbness or tingling in one or both hands AND [2] is a chronic symptom (recurrent or ongoing AND present > 4 weeks)    ONly when cold  Answer Assessment - Initial Assessment Questions 1. SYMPTOM: "What is the main symptom you are concerned about?" (e.g., weakness, numbness)     Fingers cold, yellowish color 2. ONSET: "When did this start?" (minutes, hours, days; while sleeping)     1 year ago 3. LAST NORMAL: "When was the last time you (the patient) were normal (no symptoms)?"     1 year ago 4. PATTERN "Does this come and go, or has it been constant since it started?"  "Is it present now?"     Only when cold 5. CARDIAC SYMPTOMS: "Have you had any of the following symptoms: chest pain, difficulty breathing, palpitations?"      6. NEUROLOGIC SYMPTOMS: "Have you had any of the following symptoms: headache, dizziness, vision loss, double vision, changes in speech, unsteady on your feet?"      7. OTHER SYMPTOMS: "Do you have any other symptoms?"     No.  Protocols used: Neurologic Deficit-A-AH

## 2021-07-01 ENCOUNTER — Ambulatory Visit: Payer: Managed Care, Other (non HMO) | Admitting: Family Medicine

## 2021-07-01 ENCOUNTER — Encounter: Payer: Self-pay | Admitting: Family Medicine

## 2021-07-01 ENCOUNTER — Other Ambulatory Visit: Payer: Self-pay

## 2021-07-01 VITALS — BP 118/84 | HR 72 | Resp 16 | Wt 139.6 lb

## 2021-07-01 DIAGNOSIS — R6889 Other general symptoms and signs: Secondary | ICD-10-CM | POA: Insufficient documentation

## 2021-07-01 DIAGNOSIS — F17200 Nicotine dependence, unspecified, uncomplicated: Secondary | ICD-10-CM

## 2021-07-01 DIAGNOSIS — Z1231 Encounter for screening mammogram for malignant neoplasm of breast: Secondary | ICD-10-CM | POA: Diagnosis not present

## 2021-07-01 DIAGNOSIS — R7989 Other specified abnormal findings of blood chemistry: Secondary | ICD-10-CM

## 2021-07-01 DIAGNOSIS — I7300 Raynaud's syndrome without gangrene: Secondary | ICD-10-CM | POA: Insufficient documentation

## 2021-07-01 DIAGNOSIS — R5382 Chronic fatigue, unspecified: Secondary | ICD-10-CM | POA: Diagnosis not present

## 2021-07-01 MED ORDER — VARENICLINE TARTRATE 0.5 MG PO TABS: 0.5000 mg | ORAL_TABLET | Freq: Two times a day (BID) | ORAL | 1 refills | Status: DC

## 2021-07-01 NOTE — Assessment & Plan Note (Signed)
Denies changes to breasts Request for screening mammogram

## 2021-07-01 NOTE — Assessment & Plan Note (Signed)
?    raynaud's  Check tsh No numbness/tingling into other parts of hand/arm/shoulder Denies pain

## 2021-07-01 NOTE — Assessment & Plan Note (Signed)
2nd and 3rd fingers When outside Encourage limited exposure to extreme temperature encourage proper PPE Plan to start Ca Channel blocker if TSH and Free t4 normal Pt in agreement with plan

## 2021-07-01 NOTE — Assessment & Plan Note (Signed)
Chronic, worsening with increase in work hours Pt denies difficulty sleeping Check labs today per pt request

## 2021-07-01 NOTE — Progress Notes (Signed)
Established patient visit   Patient: Susan Calderon   DOB: [birth year]   56 y.o. Female  MRN: [id number] Visit Date: 07/01/2021  Today's healthcare provider: Jacky Kindle, FNP   Chief Complaint  Patient presents with   Consult    Patient comes in office today with concerns of poor circulation in both her hands that has been present for 1 year, patient reports that she has been experiencing numbness.   Subjective    HPI HPI     Consult    Additional comments: Patient comes in office today with concerns of poor circulation in both her hands that has been present for 1 year, patient reports that she has been experiencing numbness.      Last edited by Fonda Kinder, CMA on 07/01/2021  8:11 AM.       Medications: Outpatient Medications Prior to Visit  Medication Sig Note   cholecalciferol (VITAMIN D) 1000 UNITS tablet Take 1,000 Units by mouth daily.    Cyanocobalamin (VITAMIN B 12 PO) Take 1 tablet by mouth daily.    doxycycline (VIBRAMYCIN) 50 MG capsule TAKE 1 CAPSULE BY MOUTH 2 TIMES DAILY.    Fish Oil-Cholecalciferol (FISH OIL + D3) 1000-1000 MG-UNIT CAPS Take by mouth.    ibuprofen (ADVIL,MOTRIN) 200 MG tablet Take 800 mg by mouth every 6 (six) hours as needed.    vitamin C (ASCORBIC ACID) 500 MG tablet Take 500 mg by mouth daily.    vitamin E 200 UNIT capsule Take 200 Units by mouth daily.    [DISCONTINUED] varenicline (CHANTIX STARTING MONTH PAK) 0.5 MG X 11 & 1 MG X 42 tablet Take one 0.5 mg tablet by mouth once daily for 3 days, then increase to one 0.5 mg tablet twice daily for 4 days, then increase to one 1 mg tablet twice daily.    acetaminophen (TYLENOL) 650 MG CR tablet Take 650 mg by mouth every 8 (eight) hours as needed for pain. (Patient not taking: Reported on 07/01/2021)    citalopram (CELEXA) 20 MG tablet Take 1 tablet (20 mg total) by mouth daily. (Patient not taking: Reported on 07/01/2021)    famotidine (PEPCID) 20 MG tablet Take 1 tablet (20 mg total)  by mouth daily. (Patient not taking: Reported on 07/01/2021)    fluticasone (FLONASE) 50 MCG/ACT nasal spray Place 2 sprays as needed into both nostrils.  (Patient not taking: Reported on 07/01/2021) 07/01/2021: PRN   HYDROcodone-acetaminophen (NORCO) 7.5-325 MG tablet Take 1 tablet by mouth every 6 (six) hours as needed. (Patient not taking: Reported on 07/01/2021)    meclizine (ANTIVERT) 25 MG tablet Take 0.5 tablets (12.5 mg total) by mouth 3 (three) times daily as needed for dizziness (may start with 1/2 tab, can repeat dose if needed). (Patient not taking: Reported on 07/01/2021)    meloxicam (MOBIC) 15 MG tablet TAKE 1 TABLET BY MOUTH DAILY (Patient not taking: Reported on 07/01/2021) 07/01/2021: PRN   Multiple Vitamin (MULTIVITAMIN) tablet Take 1 tablet by mouth daily. (Patient not taking: Reported on 07/01/2021)    nicotine (NICODERM CQ) 14 mg/24hr patch Place 1 patch (14 mg total) onto the skin daily. (Patient not taking: Reported on 07/01/2021)    nicotine polacrilex (NICORETTE) 4 MG gum Take 1 each (4 mg total) by mouth as needed for smoking cessation. (Patient not taking: Reported on 07/01/2021)    No facility-administered medications prior to visit.    Review of Systems     Objective    BP  118/84    Pulse 72    Resp 16    Wt 139 lb 9.6 oz (63.3 kg)    LMP 09/12/2017 Comment: Negative urine pregnancy 10-15-2017   SpO2 100%    BMI 22.88 kg/m    Physical Exam Vitals and nursing note reviewed.  Constitutional:      General: She is not in acute distress.    Appearance: Normal appearance. She is normal weight. She is not ill-appearing, toxic-appearing or diaphoretic.  HENT:     Head: Normocephalic and atraumatic.  Cardiovascular:     Rate and Rhythm: Normal rate and regular rhythm.     Pulses: Normal pulses.     Heart sounds: Normal heart sounds. No murmur heard.   No friction rub. No gallop.  Pulmonary:     Effort: Pulmonary effort is normal. No respiratory distress.     Breath sounds:  Normal breath sounds. No stridor. No wheezing, rhonchi or rales.  Chest:     Chest wall: No tenderness.  Abdominal:     General: Bowel sounds are normal.     Palpations: Abdomen is soft.  Musculoskeletal:        General: No swelling, tenderness, deformity or signs of injury. Normal range of motion.     Right lower leg: No edema.     Left lower leg: No edema.  Skin:    General: Skin is warm and dry.     Capillary Refill: Capillary refill takes less than 2 seconds.     Coloration: Skin is not jaundiced or pale.     Findings: No bruising, erythema, lesion or rash.  Neurological:     General: No focal deficit present.     Mental Status: She is alert and oriented to person, place, and time. Mental status is at baseline.     Cranial Nerves: No cranial nerve deficit.     Sensory: No sensory deficit.     Motor: No weakness.     Coordination: Coordination normal.  Psychiatric:        Mood and Affect: Mood normal.        Behavior: Behavior normal.        Thought Content: Thought content normal.        Judgment: Judgment normal.     No results found for any visits on 07/01/21.  Assessment & Plan     Problem List Items Addressed This Visit       Cardiovascular and Mediastinum   Raynaud's disease without gangrene    2nd and 3rd fingers When outside Encourage limited exposure to extreme temperature encourage proper PPE Plan to start Ca Channel blocker if TSH and Free t4 normal Pt in agreement with plan      Relevant Orders   TSH + free T4     Other   Tobacco use disorder    Request for repeat rx Previous rx was not picked up      Relevant Medications   varenicline (CHANTIX) 0.5 MG tablet   Other Relevant Orders   CT CHEST LUNG CA SCREEN LOW DOSE W/O CM   Cold intolerance of hand - Primary    ?  raynaud's  Check tsh No numbness/tingling into other parts of hand/arm/shoulder Denies pain       Relevant Orders   TSH + free T4   Encounter for screening mammogram for  malignant neoplasm of breast    Denies changes to breasts Request for screening mammogram      Relevant Orders  MM 3D SCREEN BREAST BILATERAL   Chronic fatigue    Chronic, worsening with increase in work hours Pt denies difficulty sleeping Check labs today per pt request      Relevant Orders   CBC with Differential/Platelet   Other Visit Diagnoses     Elevated serum creatinine       Relevant Orders   Comprehensive metabolic panel        Return in about 3 months (around 09/29/2021) for chonic disease management.      Leilani MerlI, Mykia Holton T Lucile Hillmann, FNP, have reviewed all documentation for this visit. The documentation on 07/01/21 for the exam, diagnosis, procedures, and orders are all accurate and complete.  Patient seen and examined by Merita NortonElise Arvine Clayburn,  FNP note scribed by Sheliah HatchKathleen Wolford, NCMA   Jacky KindleElise T Habeeb Puertas, FNP  [facility] [phone] (phone) [phone] (fax)  [facility]

## 2021-07-01 NOTE — Assessment & Plan Note (Signed)
Request for repeat rx Previous rx was not picked up

## 2021-07-02 LAB — CBC WITH DIFFERENTIAL/PLATELET
Basophils Absolute: 0 10*3/uL (ref 0.0–0.2)
Basos: 1 %
EOS (ABSOLUTE): 0.1 10*3/uL (ref 0.0–0.4)
Eos: 1 %
Hematocrit: 40 % (ref 34.0–46.6)
Hemoglobin: 13.7 g/dL (ref 11.1–15.9)
Immature Grans (Abs): 0 10*3/uL (ref 0.0–0.1)
Immature Granulocytes: 0 %
Lymphocytes Absolute: 1.6 10*3/uL (ref 0.7–3.1)
Lymphs: 32 %
MCH: 32.7 pg (ref 26.6–33.0)
MCHC: 34.3 g/dL (ref 31.5–35.7)
MCV: 96 fL (ref 79–97)
Monocytes Absolute: 0.5 10*3/uL (ref 0.1–0.9)
Monocytes: 9 %
Neutrophils Absolute: 2.9 10*3/uL (ref 1.4–7.0)
Neutrophils: 57 %
Platelets: 247 10*3/uL (ref 150–450)
RBC: 4.19 x10E6/uL (ref 3.77–5.28)
RDW: 11.7 % (ref 11.7–15.4)
WBC: 5.1 10*3/uL (ref 3.4–10.8)

## 2021-07-02 LAB — COMPREHENSIVE METABOLIC PANEL
ALT: 17 IU/L (ref 0–32)
AST: 17 IU/L (ref 0–40)
Albumin/Globulin Ratio: 2.2 (ref 1.2–2.2)
Albumin: 4.6 g/dL (ref 3.8–4.9)
Alkaline Phosphatase: 84 IU/L (ref 44–121)
BUN/Creatinine Ratio: 18 (ref 9–23)
BUN: 15 mg/dL (ref 6–24)
Bilirubin Total: 0.4 mg/dL (ref 0.0–1.2)
CO2: 23 mmol/L (ref 20–29)
Calcium: 9.4 mg/dL (ref 8.7–10.2)
Chloride: 102 mmol/L (ref 96–106)
Creatinine, Ser: 0.85 mg/dL (ref 0.57–1.00)
Globulin, Total: 2.1 g/dL (ref 1.5–4.5)
Glucose: 90 mg/dL (ref 70–99)
Potassium: 3.7 mmol/L (ref 3.5–5.2)
Sodium: 138 mmol/L (ref 134–144)
Total Protein: 6.7 g/dL (ref 6.0–8.5)
eGFR: 81 mL/min/{1.73_m2} (ref >59)

## 2021-07-02 LAB — TSH+FREE T4
Free T4: 1.4 ng/dL (ref 0.82–1.77)
TSH: 1.53 u[IU]/mL (ref 0.450–4.500)

## 2021-07-04 ENCOUNTER — Other Ambulatory Visit: Payer: Self-pay | Admitting: Family Medicine

## 2021-07-04 DIAGNOSIS — I7300 Raynaud's syndrome without gangrene: Secondary | ICD-10-CM

## 2021-07-04 MED ORDER — AMLODIPINE BESYLATE 5 MG PO TABS
5.0000 mg | ORAL_TABLET | Freq: Every day | ORAL | 5 refills | Status: DC
Start: 2021-07-04 — End: 2022-03-17

## 2021-08-23 ENCOUNTER — Ambulatory Visit
Admission: RE | Admit: 2021-08-23 | Discharge: 2021-08-23 | Disposition: A | Discharge status: Home / Self Care | Payer: Managed Care, Other (non HMO) | Source: Ambulatory Visit | Attending: Family Medicine | Admitting: Family Medicine

## 2021-08-23 ENCOUNTER — Other Ambulatory Visit: Payer: Self-pay

## 2021-08-23 DIAGNOSIS — Z1231 Encounter for screening mammogram for malignant neoplasm of breast: Secondary | ICD-10-CM | POA: Insufficient documentation

## 2021-08-25 ENCOUNTER — Telehealth: Payer: Self-pay | Admitting: *Deleted

## 2021-08-25 NOTE — Telephone Encounter (Signed)
Left message for pt to call back to schedule f/u lung screening CT scan.  ?

## 2021-08-30 ENCOUNTER — Telehealth: Payer: Self-pay | Admitting: Acute Care

## 2021-08-30 NOTE — Telephone Encounter (Signed)
Attempted to schedule annual LDCT scan, call went to VM with message left. ?

## 2021-11-08 NOTE — Progress Notes (Unsigned)
Complete physical exam   Patient: Susan Calderon   DOB: Oct 25, 1965   56 y.o. Female  MRN: [id number] Visit Date: 11/10/2021  Today's healthcare provider: Jacky Kindle, FNP  Re Introduced to nurse practitioner role and practice setting.  All questions answered.  Discussed provider/patient relationship and expectations.   I,Tiffany J Bragg,acting as a scribe for Jacky Kindle, FNP.,have documented all relevant documentation on the behalf of Jacky Kindle, FNP,as directed by  Jacky Kindle, FNP while in the presence of Jacky Kindle, FNP.   Chief Complaint  Patient presents with   Annual Exam   Subjective    Susan Calderon is a 56 y.o. female who presents today for a complete physical exam.  She reports consuming a general diet. The patient does not participate in regular exercise at present. She generally feels poorly. She reports sleeping fairly well. She does not have additional problems to discuss today.  HPI  Mammogram done 08/23/2021 Pap due 2025 Colonoscopy [place]:10/15/2017  Past Medical History:  Diagnosis Date   Allergy    Rosacea    Tobacco use disorder    Past Surgical History:  Procedure Laterality Date   BUNIONECTOMY     CESAREAN SECTION     COLONOSCOPY WITH PROPOFOL N/A 10/15/2017   Procedure: COLONOSCOPY WITH PROPOFOL;  Surgeon: Wyline Mood, MD;  Location: [facility] ENDOSCOPY;  Service: Gastroenterology;  Laterality: N/A;   TONSILLECTOMY     TUBAL LIGATION     Social History   Socioeconomic History   Marital status: Divorced    Spouse name: Not on file   Number of children: Not on file   Years of education: Not on file   Highest education level: Not on file  Occupational History   Not on file  Tobacco Use   Smoking status: Every Day    Packs/day: 1.25    Years: 38.00    Pack years: 47.50    Types: Cigarettes   Smokeless tobacco: Never  Vaping Use   Vaping Use: Never used  Substance and Sexual Activity   Alcohol use: Yes    Comment: on occasion   Drug  use: No   Sexual activity: Never  Other Topics Concern   Not on file  Social History Narrative   Not on file   Social Determinants of Health   Financial Resource Strain: Not on file  Food Insecurity: Not on file  Transportation Needs: Not on file  Physical Activity: Not on file  Stress: Not on file  Social Connections: Not on file  Intimate Partner Violence: Not on file   Family Status  Relation Name Status   Mother  Alive   Father  Deceased at age 38   Sister  Alive   Brother  Alive   Daughter  Alive   Son  Alive   MGM  Deceased   MGF  Deceased   PGM  Deceased   PGF  Deceased   Brother  Alive   Brother  Alive   Brother  Alive   Sister  Alive   Neg Hx  (Not Specified)   Family History  Problem Relation Age of Onset   Hypothyroidism Mother    Osteoporosis Mother    Hypertension Mother    Heart attack Father    CAD Father    Stroke Maternal Grandmother    Breast cancer Neg Hx    Colon cancer Neg Hx    Allergies  Allergen Reactions   Tree Extract  unknown    Patient Care Team: Jacky Kindle, FNP as PCP - General (Family Medicine) Kieth Brightly, MD (General Surgery) Gabriel Cirri, NP as Nurse Practitioner (Nurse Practitioner)   Medications: Outpatient Medications Prior to Visit  Medication Sig   amLODipine (NORVASC) 5 MG tablet Take 1 tablet (5 mg total) by mouth daily.   cholecalciferol (VITAMIN D) 1000 UNITS tablet Take 1,000 Units by mouth daily.   Cyanocobalamin (VITAMIN B 12 PO) Take 1 tablet by mouth daily.   Fish Oil-Cholecalciferol (FISH OIL + D3) 1000-1000 MG-UNIT CAPS Take by mouth.   ibuprofen (ADVIL,MOTRIN) 200 MG tablet Take 800 mg by mouth every 6 (six) hours as needed.   vitamin C (ASCORBIC ACID) 500 MG tablet Take 500 mg by mouth daily.   vitamin E 200 UNIT capsule Take 200 Units by mouth daily.   varenicline (CHANTIX) 0.5 MG tablet Take 1 tablet (0.5 mg total) by mouth 2 (two) times daily. (Patient not taking: Reported on  11/10/2021)   [DISCONTINUED] acetaminophen (TYLENOL) 650 MG CR tablet Take 650 mg by mouth every 8 (eight) hours as needed for pain. (Patient not taking: Reported on 07/01/2021)   [DISCONTINUED] citalopram (CELEXA) 20 MG tablet Take 1 tablet (20 mg total) by mouth daily. (Patient not taking: Reported on 07/01/2021)   [DISCONTINUED] doxycycline (VIBRAMYCIN) 50 MG capsule TAKE 1 CAPSULE BY MOUTH 2 TIMES DAILY. (Patient not taking: Reported on 11/10/2021)   [DISCONTINUED] famotidine (PEPCID) 20 MG tablet Take 1 tablet (20 mg total) by mouth daily. (Patient not taking: Reported on 07/01/2021)   [DISCONTINUED] fluticasone (FLONASE) 50 MCG/ACT nasal spray Place 2 sprays as needed into both nostrils.  (Patient not taking: Reported on 07/01/2021)   [DISCONTINUED] HYDROcodone-acetaminophen (NORCO) 7.5-325 MG tablet Take 1 tablet by mouth every 6 (six) hours as needed. (Patient not taking: Reported on 07/01/2021)   [DISCONTINUED] meclizine (ANTIVERT) 25 MG tablet Take 0.5 tablets (12.5 mg total) by mouth 3 (three) times daily as needed for dizziness (may start with 1/2 tab, can repeat dose if needed). (Patient not taking: Reported on 07/01/2021)   [DISCONTINUED] meloxicam (MOBIC) 15 MG tablet TAKE 1 TABLET BY MOUTH DAILY (Patient not taking: Reported on 07/01/2021)   [DISCONTINUED] Multiple Vitamin (MULTIVITAMIN) tablet Take 1 tablet by mouth daily. (Patient not taking: Reported on 07/01/2021)   [DISCONTINUED] nicotine (NICODERM CQ) 14 mg/24hr patch Place 1 patch (14 mg total) onto the skin daily. (Patient not taking: Reported on 07/01/2021)   [DISCONTINUED] nicotine polacrilex (NICORETTE) 4 MG gum Take 1 each (4 mg total) by mouth as needed for smoking cessation. (Patient not taking: Reported on 07/01/2021)   No facility-administered medications prior to visit.    Review of Systems    Objective     BP 121/75 (BP Location: Right Arm, Patient Position: Sitting, Cuff Size: Normal)   Pulse 70   Temp 97.7 F (36.5 C)  (Oral)   Resp 16   Ht 5\' 5"  (1.651 m)   Wt 143 lb 3.2 oz (65 kg)   LMP 09/12/2017 Comment: Negative urine pregnancy 10-15-2017  SpO2 99%   BMI 23.83 kg/m     Physical Exam Vitals and nursing note reviewed.  Constitutional:      General: She is awake. She is not in acute distress.    Appearance: Normal appearance. She is well-developed, well-groomed and normal weight. She is not ill-appearing, toxic-appearing or diaphoretic.  HENT:     Head: Normocephalic and atraumatic.     Jaw: There is normal jaw occlusion.  No trismus, tenderness, swelling or pain on movement.     Right Ear: Hearing, tympanic membrane, ear canal and external ear normal. There is no impacted cerumen.     Left Ear: Hearing, tympanic membrane, ear canal and external ear normal. There is no impacted cerumen.     Nose: Nose normal. No congestion or rhinorrhea.     Right Turbinates: Not enlarged, swollen or pale.     Left Turbinates: Not enlarged, swollen or pale.     Right Sinus: No maxillary sinus tenderness or frontal sinus tenderness.     Left Sinus: No maxillary sinus tenderness or frontal sinus tenderness.     Mouth/Throat:     Lips: Pink.     Mouth: Mucous membranes are moist. No injury.     Tongue: No lesions.     Pharynx: Oropharynx is clear. Uvula midline. No pharyngeal swelling, oropharyngeal exudate, posterior oropharyngeal erythema or uvula swelling.     Tonsils: No tonsillar exudate or tonsillar abscesses.  Eyes:     General: Lids are normal. Lids are everted, no foreign bodies appreciated. Vision grossly intact. Gaze aligned appropriately. No allergic shiner or visual field deficit.       Right eye: No discharge.        Left eye: No discharge.     Extraocular Movements: Extraocular movements intact.     Conjunctiva/sclera: Conjunctivae normal.     Right eye: Right conjunctiva is not injected. No exudate.    Left eye: Left conjunctiva is not injected. No exudate.    Pupils: Pupils are equal, round, and  reactive to light.  Neck:     Thyroid: No thyroid mass, thyromegaly or thyroid tenderness.     Vascular: No carotid bruit.     Trachea: Trachea normal.  Cardiovascular:     Rate and Rhythm: Normal rate and regular rhythm.     Pulses: Normal pulses.          Carotid pulses are 2+ on the right side and 2+ on the left side.      Radial pulses are 2+ on the right side and 2+ on the left side.       Dorsalis pedis pulses are 2+ on the right side and 2+ on the left side.       Posterior tibial pulses are 2+ on the right side and 2+ on the left side.     Heart sounds: Normal heart sounds, S1 normal and S2 normal. No murmur heard.   No friction rub. No gallop.  Pulmonary:     Effort: Pulmonary effort is normal. No respiratory distress.     Breath sounds: Normal breath sounds and air entry. No stridor. No wheezing, rhonchi or rales.  Chest:     Chest wall: No tenderness.     Comments: Breasts: risk and benefit of breast self-exam was discussed, patient declines to have breast exam  Abdominal:     General: Abdomen is flat. Bowel sounds are normal. There is no distension.     Palpations: Abdomen is soft. There is no mass.     Tenderness: There is no abdominal tenderness. There is no right CVA tenderness, left CVA tenderness, guarding or rebound.     Hernia: No hernia is present.  Genitourinary:    Comments: Exam deferred; denies complaints Musculoskeletal:        General: No swelling, tenderness, deformity or signs of injury. Normal range of motion.     Cervical back: Full passive range of motion without  pain, normal range of motion and neck supple. No edema, rigidity or tenderness. No muscular tenderness.     Right lower leg: No edema.     Left lower leg: No edema.       Legs:     Comments: Brace from ortho brake in place  Lymphadenopathy:     Cervical: No cervical adenopathy.     Right cervical: No superficial, deep or posterior cervical adenopathy.    Left cervical: No superficial,  deep or posterior cervical adenopathy.  Skin:    General: Skin is warm and dry.     Capillary Refill: Capillary refill takes less than 2 seconds.     Coloration: Skin is not jaundiced or pale.     Findings: No bruising, erythema, lesion or rash.  Neurological:     General: No focal deficit present.     Mental Status: She is alert and oriented to person, place, and time. Mental status is at baseline.     GCS: GCS eye subscore is 4. GCS verbal subscore is 5. GCS motor subscore is 6.     Sensory: Sensation is intact. No sensory deficit.     Motor: Motor function is intact. No weakness.     Coordination: Coordination is intact. Coordination normal.     Gait: Gait is intact. Gait normal.  Psychiatric:        Attention and Perception: Attention and perception normal.        Mood and Affect: Mood normal. Affect is flat.        Speech: Speech normal.        Behavior: Behavior normal. Behavior is cooperative.        Thought Content: Thought content normal.        Cognition and Memory: Cognition and memory normal.        Judgment: Judgment normal.      Last depression screening scores    11/10/2021    8:13 AM 10/24/2019    9:13 AM 10/10/2018    8:36 AM  PHQ 2/9 Scores  PHQ - 2 Score 0 0 0  PHQ- 9 Score 0 0    Last fall risk screening    11/10/2021    8:13 AM  Fall Risk   Falls in the past year? 0  Number falls in past yr: 0  Injury with Fall? 0   Last Audit-C alcohol use screening    11/10/2021    8:14 AM  Alcohol Use Disorder Test (AUDIT)  1. How often do you have a drink containing alcohol? 3  2. How many drinks containing alcohol do you have on a typical day when you are drinking? 1  3. How often do you have six or more drinks on one occasion? 0  AUDIT-C Score 4   A score of 3 or more in women, and 4 or more in men indicates increased risk for alcohol abuse, EXCEPT if all of the points are from question 1   No results found for any visits on 11/10/21.  Assessment & Plan     Routine Health Maintenance and Physical Exam  Exercise Activities and Dietary recommendations  Goals   None     Immunization History  Administered Date(s) Administered   Influenza,inj,Quad PF,6+ Mos 03/28/2021   Influenza-Unspecified 03/22/2016   PFIZER(Purple Top)SARS-COV-2 Vaccination 10/24/2019, 11/21/2019   Pneumococcal Polysaccharide-23 03/11/2008   Td 03/21/2006   Tdap 04/28/2016    Health Maintenance  Topic Date Due   Zoster Vaccines- Shingrix (1 of  2) Never done   COVID-19 Vaccine (3 - Booster for Pfizer series) 01/16/2020   INFLUENZA VACCINE  01/10/2022   MAMMOGRAM  08/24/2023   PAP SMEAR-Modifier  10/10/2023   TETANUS/TDAP  04/28/2026   COLONOSCOPY (Pts 45-80yrs Insurance coverage will need to be confirmed)  10/16/2027   Hepatitis C Screening  Completed   HIV Screening  Completed   HPV VACCINES  Aged Out    Discussed health benefits of physical activity, and encouraged her to engage in regular exercise appropriate for her age and condition.  Problem List Items Addressed This Visit       Respiratory   COPD, mild (HCC)    Chronic, stable Has cut back on cigarette use, currently smoking 16 cigs/24 hours Previous smoking 20 cig/24 hours Has smoked for 39+ years Recommend annual CT scan for lung cancer screening LCTAB       Relevant Orders   CT CHEST LUNG CA SCREEN LOW DOSE W/O CM     Other   Annual physical exam - Primary    UTD on dental Due for vision in July/August 2023 Recent fracture of LLE near ankle 10/2021- is followed by ortho, asked for additional narcotics, encouraged to call ortho PAP due 2025 Colon CA screen due 2029 Mammo due 2024 Labs UTD minus lipids  Things to do to keep yourself healthy  - Exercise at least 30-45 minutes a day, 3-4 days a week.  - Eat a low-fat diet with lots of fruits and vegetables, up to 7-9 servings per day.  - Seatbelts can save your life. Wear them always.  - Smoke detectors on every level of your home,  check batteries every year.  - Eye Doctor - have an eye exam every 1-2 years  - Safe sex - if you may be exposed to STDs, use a condom.  - Alcohol -  If you drink, do it moderately, less than 2 drinks per day.  - Health Care Power of Attorney. Choose someone to speak for you if you are not able.  - Depression is common in our stressful world.If you're feeling down or losing interest in things you normally enjoy, please come in for a visit.  - Violence - If anyone is threatening or hurting you, please call immediately.        Relevant Orders   Lipid panel   Tobacco use disorder    Chronic, stable- mild COPD Has cut back on cigarette use, currently smoking 16 cigs/24 hours Previous smoking 20 cig/24 hours Has smoked for 39+ years Recommend annual CT scan for lung cancer screening LCTAB        Relevant Orders   CT CHEST LUNG CA SCREEN LOW DOSE W/O CM     Return in about 6 months (around 05/12/2022) for chonic disease management.     Leilani Merl, FNP, have reviewed all documentation for this visit. The documentation on 11/10/21 for the exam, diagnosis, procedures, and orders are all accurate and complete.    Jacky Kindle, FNP  [facility] [phone] (phone) [phone] (fax)  [facility]

## 2021-11-10 ENCOUNTER — Encounter: Payer: Self-pay | Admitting: Family Medicine

## 2021-11-10 ENCOUNTER — Ambulatory Visit (INDEPENDENT_AMBULATORY_CARE_PROVIDER_SITE_OTHER): Payer: Managed Care, Other (non HMO) | Admitting: Family Medicine

## 2021-11-10 VITALS — BP 121/75 | HR 70 | Temp 97.7°F | Resp 16 | Ht 65.0 in | Wt 143.2 lb

## 2021-11-10 DIAGNOSIS — J449 Chronic obstructive pulmonary disease, unspecified: Secondary | ICD-10-CM | POA: Diagnosis not present

## 2021-11-10 DIAGNOSIS — F17200 Nicotine dependence, unspecified, uncomplicated: Secondary | ICD-10-CM

## 2021-11-10 DIAGNOSIS — Z0000 Encounter for general adult medical examination without abnormal findings: Secondary | ICD-10-CM | POA: Diagnosis not present

## 2021-11-10 NOTE — Assessment & Plan Note (Signed)
Chronic, stable- mild COPD Has cut back on cigarette use, currently smoking 16 cigs/24 hours Previous smoking 20 cig/24 hours Has smoked for 39+ years Recommend annual CT scan for lung cancer screening LCTAB

## 2021-11-10 NOTE — Assessment & Plan Note (Signed)
UTD on dental Due for vision in July/August 2023 Recent fracture of LLE near ankle 10/2021- is followed by ortho, asked for additional narcotics, encouraged to call ortho PAP due 2025 Colon CA screen due 2029 Mammo due 2024 Labs UTD minus lipids  Things to do to keep yourself healthy  - Exercise at least 30-45 minutes a day, 3-4 days a week.  - Eat a low-fat diet with lots of fruits and vegetables, up to 7-9 servings per day.  - Seatbelts can save your life. Wear them always.  - Smoke detectors on every level of your home, check batteries every year.  - Eye Doctor - have an eye exam every 1-2 years  - Safe sex - if you may be exposed to STDs, use a condom.  - Alcohol -  If you drink, do it moderately, less than 2 drinks per day.  - Health Care Power of Attorney. Choose someone to speak for you if you are not able.  - Depression is common in our stressful world.If you're feeling down or losing interest in things you normally enjoy, please come in for a visit.  - Violence - If anyone is threatening or hurting you, please call immediately.

## 2021-11-10 NOTE — Assessment & Plan Note (Signed)
Chronic, stable Has cut back on cigarette use, currently smoking 16 cigs/24 hours Previous smoking 20 cig/24 hours Has smoked for 39+ years Recommend annual CT scan for lung cancer screening LCTAB

## 2021-11-10 NOTE — Patient Instructions (Signed)
The 10-year ASCVD risk score (Arnett DK, et al., 2019) is: 2.7%   Values used to calculate the score:     Age: 56 years     Sex: Female     Is Non-Hispanic African American: No     Diabetic: No     Tobacco smoker: Yes     Systolic Blood Pressure: 121 mmHg     Is BP treated: No     HDL Cholesterol: 80 mg/dL     Total Cholesterol: 181 mg/dL  The CDC recommends two doses of Shingrix (the new shingles vaccine) separated by 2 to 6 months for adults age 1 years and older. I recommend checking with your insurance plan regarding coverage for this vaccine.

## 2021-11-11 LAB — LIPID PANEL
Chol/HDL Ratio: 2.3 ratio (ref 0.0–4.4)
Cholesterol, Total: 155 mg/dL (ref 100–199)
HDL: 68 mg/dL (ref >39)
LDL Chol Calc (NIH): 66 mg/dL (ref 0–99)
Triglycerides: 117 mg/dL (ref 0–149)
VLDL Cholesterol Cal: 21 mg/dL (ref 5–40)

## 2022-01-04 ENCOUNTER — Telehealth: Payer: Self-pay | Admitting: Acute Care

## 2022-01-04 NOTE — Telephone Encounter (Signed)
Unable to reach patient x 3 to schedule annual LDCT. Letter mailed today requesting patient to contact us for scheduling

## 2022-03-06 ENCOUNTER — Telehealth: Payer: Self-pay | Admitting: Family Medicine

## 2022-03-06 NOTE — Telephone Encounter (Signed)
Dr. Marlowe Sax with [facility] called leaving his E-mail address for Tally Joe to contact him regarding the patients xrays.  [email or web address]  Or call  [phone]

## 2022-03-08 NOTE — Telephone Encounter (Signed)
Pt returned call, I advised patient of how to do this step by step.

## 2022-03-08 NOTE — Telephone Encounter (Signed)
Called patient to ask if she can upload the xrays to her mychart or send them to [place] through a mychart message we would be able to view them, as we are unable to receive them in an email. LMTCB. Okay for [facility] to advise.

## 2022-03-14 NOTE — Progress Notes (Signed)
Established patient visit   Patient: Susan Calderon   DOB: 22-Aug-1965   56 y.o. Female  MRN: [id number] Visit Date: 03/17/2022  Today's healthcare provider: Jacky Kindle, FNP  Re Introduced to nurse practitioner role and practice setting.  All questions answered.  Discussed provider/patient relationship and expectations.   I,Tiffany J Bragg,acting as a scribe for Jacky Kindle, FNP.,have documented all relevant documentation on the behalf of Jacky Kindle, FNP,as directed by  Jacky Kindle, FNP while in the presence of Jacky Kindle, FNP.   Chief Complaint  Patient presents with   Neck Pain   Subjective    HPI HPI   Patient states she was at the chiropractor who did xrays and said they never saw a neck xray that looked like hers. York Spaniel it looked like she has no cartilage and her bones are inverted and  pressing on her spinal cord and discs are together.  Last edited by Marlana Salvage, CMA on 03/17/2022  8:33 AM.       Medications: Outpatient Medications Prior to Visit  Medication Sig   cholecalciferol (VITAMIN D) 1000 UNITS tablet Take 1,000 Units by mouth daily.   Cyanocobalamin (VITAMIN B 12 PO) Take 1 tablet by mouth daily.   Fish Oil-Cholecalciferol (FISH OIL + D3) 1000-1000 MG-UNIT CAPS Take by mouth.   HYDROcodone-acetaminophen (NORCO/VICODIN) 5-325 MG tablet Take 1 tablet by mouth every 6 (six) hours as needed.   ibuprofen (ADVIL,MOTRIN) 200 MG tablet Take 800 mg by mouth every 6 (six) hours as needed.   naproxen (NAPROSYN) 500 MG tablet Take 1 tablet twice a day by oral route for 30 days.   varenicline (CHANTIX) 0.5 MG tablet Take 1 tablet (0.5 mg total) by mouth 2 (two) times daily.   vitamin C (ASCORBIC ACID) 500 MG tablet Take 500 mg by mouth daily.   vitamin E 200 UNIT capsule Take 200 Units by mouth daily.   [DISCONTINUED] amLODipine (NORVASC) 5 MG tablet Take 1 tablet (5 mg total) by mouth daily.   No facility-administered medications prior to visit.     Review of Systems    Objective    BP 115/68 (BP Location: Right Arm, Patient Position: Sitting, Cuff Size: Normal)   Pulse 78   Temp 98.3 F (36.8 C)   Resp 17   Ht 5\' 5"  (1.651 m)   Wt 138 lb (62.6 kg)   LMP 09/12/2017 Comment: Negative urine pregnancy 10-15-2017  SpO2 100%   BMI 22.96 kg/m   Physical Exam Vitals and nursing note reviewed.  Constitutional:      General: She is not in acute distress.    Appearance: Normal appearance. She is normal weight. She is not ill-appearing, toxic-appearing or diaphoretic.  HENT:     Head: Normocephalic and atraumatic.  Neck:     Comments: Chronic neck pain; presents with xrays on phone from chiro Cardiovascular:     Rate and Rhythm: Normal rate and regular rhythm.     Pulses: Normal pulses.     Heart sounds: Normal heart sounds. No murmur heard.    No friction rub. No gallop.  Pulmonary:     Effort: Pulmonary effort is normal. No respiratory distress.     Breath sounds: Normal breath sounds. No stridor. No wheezing, rhonchi or rales.  Chest:     Chest wall: No tenderness.  Musculoskeletal:        General: No swelling, tenderness, deformity or signs of injury.  Cervical back: Decreased range of motion.     Right lower leg: No edema.     Left lower leg: No edema.  Skin:    General: Skin is warm and dry.     Capillary Refill: Capillary refill takes less than 2 seconds.     Coloration: Skin is not jaundiced or pale.     Findings: No bruising, erythema, lesion or rash.     Comments: Continues to report white tips of fingers concerning for Raynauds; however, did not start medication previously; encouraged use   Neurological:     General: No focal deficit present.     Mental Status: She is alert and oriented to person, place, and time. Mental status is at baseline.     Cranial Nerves: No cranial nerve deficit.     Sensory: No sensory deficit.     Motor: No weakness.     Coordination: Coordination normal.  Psychiatric:         Mood and Affect: Mood normal.        Behavior: Behavior normal.        Thought Content: Thought content normal.        Judgment: Judgment normal.      No results found for any visits on 03/17/22.  Assessment & Plan     Problem List Items Addressed This Visit       Cardiovascular and Mediastinum   Raynaud's disease without gangrene    Chronic, stable Continue to recommend trial of medication to assist Pt did not pick up when it was ordered 06/2021      Relevant Medications   amLODipine (NORVASC) 5 MG tablet   Other Relevant Orders   Ambulatory referral to Orthopedic Surgery     Other   Cervicalgia - Primary    Chronic, stable Request for further imaging and referrals Has been seen by Chiro who completed xrays       Relevant Orders   MR Cervical Spine Wo Contrast   Ambulatory referral to Orthopedic Surgery   Need for influenza vaccination    Consented; VIS made available; no immediate side effects following administration; plan to repeat annually        Relevant Orders   Flu Vaccine QUAD 6+ mos PF IM (Fluarix Quad PF) (Completed)     No follow-ups on file.      Vonna Kotyk, FNP, have reviewed all documentation for this visit. The documentation on 03/17/22 for the exam, diagnosis, procedures, and orders are all accurate and complete.    Gwyneth Sprout, [place] [phone] (phone) [phone] (fax)  [place]

## 2022-03-17 ENCOUNTER — Ambulatory Visit: Payer: Managed Care, Other (non HMO) | Admitting: Family Medicine

## 2022-03-17 ENCOUNTER — Other Ambulatory Visit: Payer: Self-pay | Admitting: Family Medicine

## 2022-03-17 ENCOUNTER — Encounter: Payer: Self-pay | Admitting: Family Medicine

## 2022-03-17 VITALS — BP 115/68 | HR 78 | Temp 98.3°F | Resp 17 | Ht 65.0 in | Wt 138.0 lb

## 2022-03-17 DIAGNOSIS — Z23 Encounter for immunization: Secondary | ICD-10-CM | POA: Diagnosis not present

## 2022-03-17 DIAGNOSIS — M542 Cervicalgia: Secondary | ICD-10-CM

## 2022-03-17 DIAGNOSIS — I7300 Raynaud's syndrome without gangrene: Secondary | ICD-10-CM | POA: Diagnosis not present

## 2022-03-17 MED ORDER — AMLODIPINE BESYLATE 5 MG PO TABS: 5.0000 mg | ORAL_TABLET | Freq: Every day | ORAL | 5 refills | Status: DC

## 2022-03-17 NOTE — Assessment & Plan Note (Signed)
Consented; VIS made available; no immediate side effects following administration; plan to repeat annually   

## 2022-03-17 NOTE — Assessment & Plan Note (Signed)
Chronic, stable Request for further imaging and referrals Has been seen by Chiro who completed xrays

## 2022-03-17 NOTE — Assessment & Plan Note (Signed)
Chronic, stable Continue to recommend trial of medication to assist Pt did not pick up when it was ordered 06/2021

## 2022-03-21 ENCOUNTER — Telehealth: Payer: Self-pay

## 2022-03-21 NOTE — Telephone Encounter (Signed)
Copied from [place] [id number]. Topic: General - Other >> Mar 21, 2022 12:27 PM Burman Freestone wrote: Reason for CRM: Pt is requesting for you to add MRI lumbar spine for low back pain so she can have both cervical and lumbar at the same time. I do not see any documentation to get this appoved

## 2022-03-21 NOTE — Telephone Encounter (Signed)
Copied from [place] [id number]. Topic: General - Other >> Mar 21, 2022 12:12 PM Dominique E wrote: Reason for CRM: Pt wants to have her spine and brain scanned along with her current MRI orders? Wants to be evaluated for any possible aneurisms

## 2022-03-23 NOTE — Telephone Encounter (Signed)
Spoke with patient.

## 2022-03-24 ENCOUNTER — Ambulatory Visit: Payer: Managed Care, Other (non HMO)

## 2022-03-31 ENCOUNTER — Other Ambulatory Visit: Payer: Self-pay | Admitting: Family Medicine

## 2022-03-31 DIAGNOSIS — M4802 Spinal stenosis, cervical region: Secondary | ICD-10-CM

## 2022-03-31 DIAGNOSIS — M5030 Other cervical disc degeneration, unspecified cervical region: Secondary | ICD-10-CM

## 2022-03-31 DIAGNOSIS — M4310 Spondylolisthesis, site unspecified: Secondary | ICD-10-CM

## 2022-03-31 DIAGNOSIS — M4012 Other secondary kyphosis, cervical region: Secondary | ICD-10-CM

## 2022-03-31 DIAGNOSIS — M4722 Other spondylosis with radiculopathy, cervical region: Secondary | ICD-10-CM

## 2022-03-31 NOTE — Telephone Encounter (Signed)
Patient called and given results of MRI and that a referral placed, she says she will call her insurance to verify [facility] is covered.

## 2022-03-31 NOTE — Telephone Encounter (Signed)
-----   Message from Gwyneth Sprout, FNP sent at 03/31/2022 12:01 PM EDT ----- MRI completed at [facility] with cervical spondylosis, kyphosis, and DDD with retrosthesis at c4/5, c5/6, and c 6/7 Referral placed to neurosurgery.  Gwyneth Sprout, [address] [place], [place] [address] [phone] (phone) [phone] (fax) [place]

## 2022-04-14 ENCOUNTER — Ambulatory Visit
Admission: RE | Admit: 2022-04-14 | Discharge: 2022-04-14 | Disposition: A | Discharge status: Home / Self Care | Payer: Self-pay | Source: Ambulatory Visit | Attending: Orthopedic Surgery | Admitting: Orthopedic Surgery

## 2022-04-14 ENCOUNTER — Other Ambulatory Visit: Payer: Self-pay

## 2022-04-14 DIAGNOSIS — Z049 Encounter for examination and observation for unspecified reason: Secondary | ICD-10-CM
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
## 2022-04-21 ENCOUNTER — Ambulatory Visit
Admission: RE | Admit: 2022-04-21 | Discharge: 2022-04-21 | Disposition: A | Discharge status: Home / Self Care | Payer: Managed Care, Other (non HMO) | Attending: Orthopedic Surgery | Admitting: Orthopedic Surgery

## 2022-04-21 ENCOUNTER — Ambulatory Visit
Admission: RE | Admit: 2022-04-21 | Discharge: 2022-04-21 | Disposition: A | Discharge status: Home / Self Care | Payer: Managed Care, Other (non HMO) | Source: Ambulatory Visit | Attending: Orthopedic Surgery | Admitting: Orthopedic Surgery

## 2022-04-21 ENCOUNTER — Encounter: Payer: Self-pay | Admitting: Orthopedic Surgery

## 2022-04-21 ENCOUNTER — Other Ambulatory Visit: Payer: Self-pay | Admitting: Orthopedic Surgery

## 2022-04-21 ENCOUNTER — Ambulatory Visit (INDEPENDENT_AMBULATORY_CARE_PROVIDER_SITE_OTHER): Payer: Managed Care, Other (non HMO) | Admitting: Orthopedic Surgery

## 2022-04-21 VITALS — BP 118/78 | Ht 65.0 in | Wt 141.0 lb

## 2022-04-21 DIAGNOSIS — M542 Cervicalgia: Secondary | ICD-10-CM

## 2022-06-01 IMAGING — CT CT CHEST LUNG CANCER SCREENING LOW DOSE W/O CM
2 of 5 series · 15 of 40 positions shown, 18 images · non-contrast
Comparison: No priors.

CLINICAL DATA: 54-year-old female current smoker with 48 pack-year
history of smoking. Lung cancer screening examination. History of
0SSKZ-SD infection in December 2019.

EXAM:
CT CHEST WITHOUT CONTRAST LOW-DOSE FOR LUNG CANCER SCREENING
TECHNIQUE: Multidetector CT imaging of the chest was performed following the
standard protocol without IV contrast.

[Series 3: lung 1.00 · axial · 0.61mm/px · z∈[-1233,-908]mm · 12 of 359 slices shown, 15 images]
[im 17/359  mediastinal]
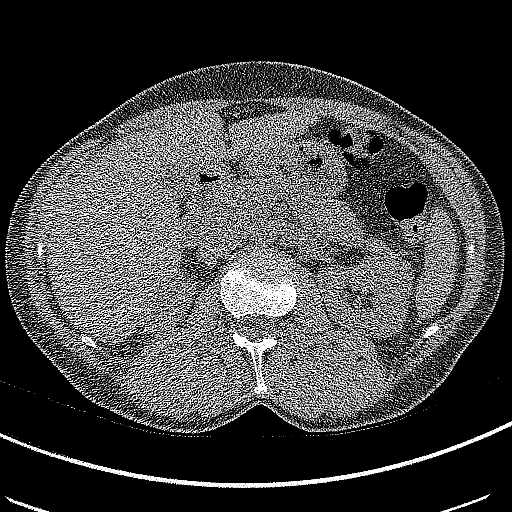
[im 17/359  lung]
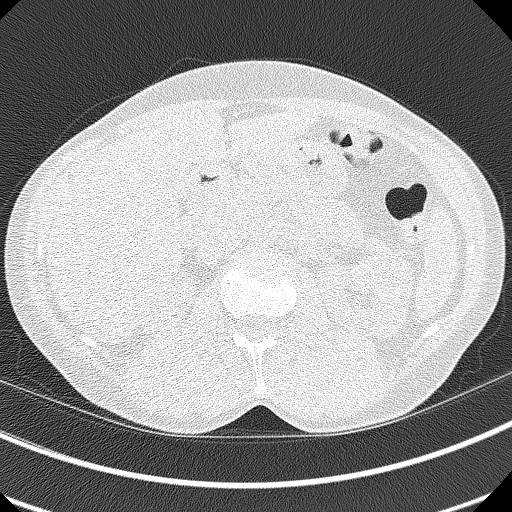
[im 49/359  lung]
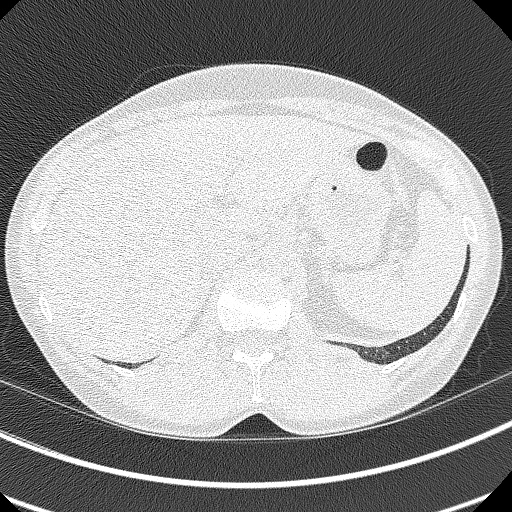
[im 82/359  lung]
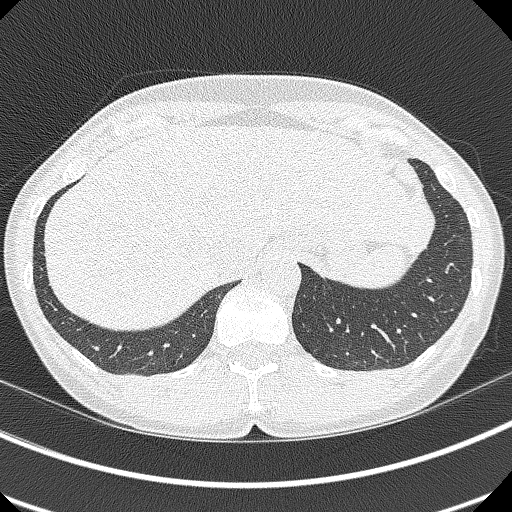
[im 114/359  lung]
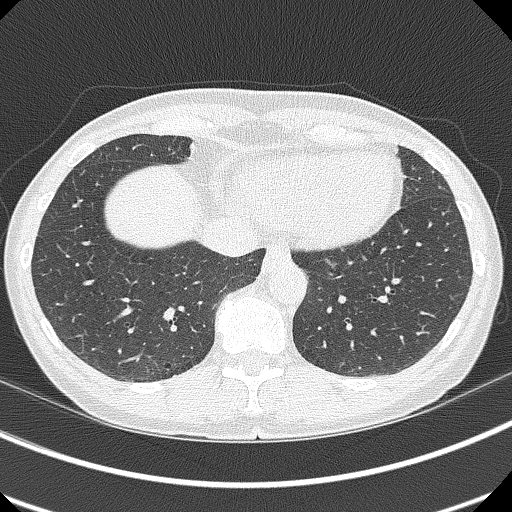
[im 131/359  mediastinal]
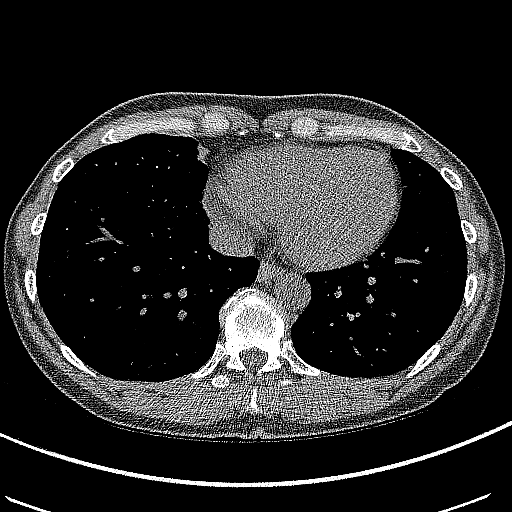
[im 131/359  lung]
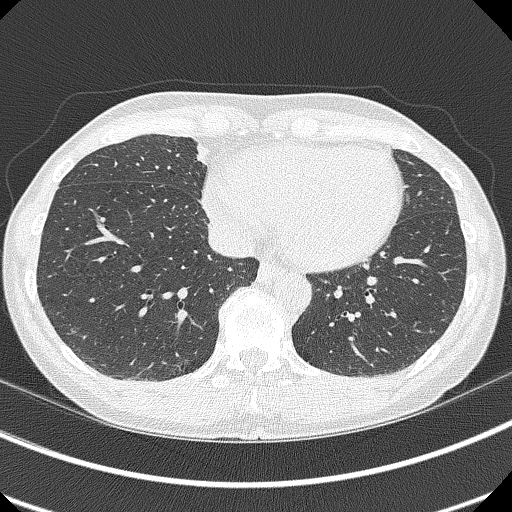
[im 163/359  lung]
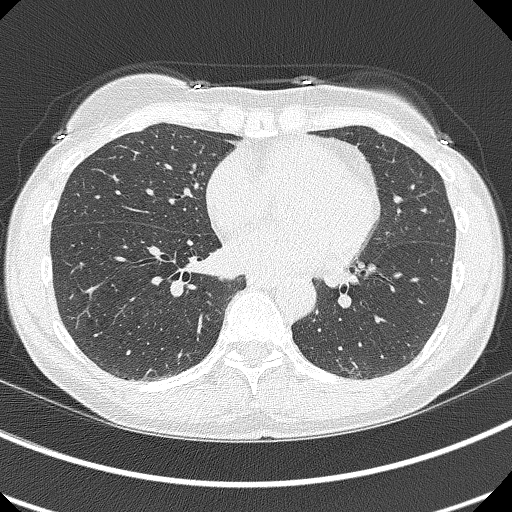
[im 196/359  lung]
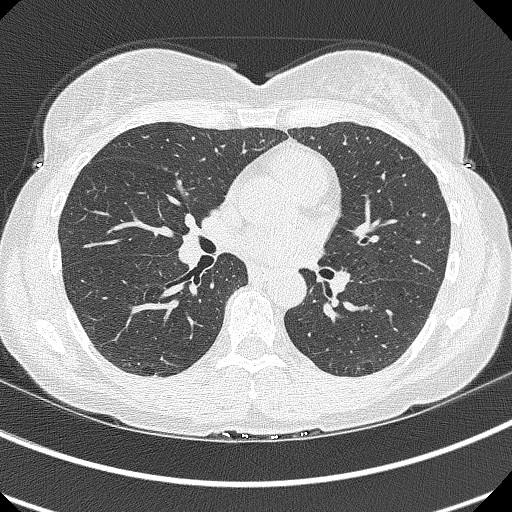
[im 228/359  lung]
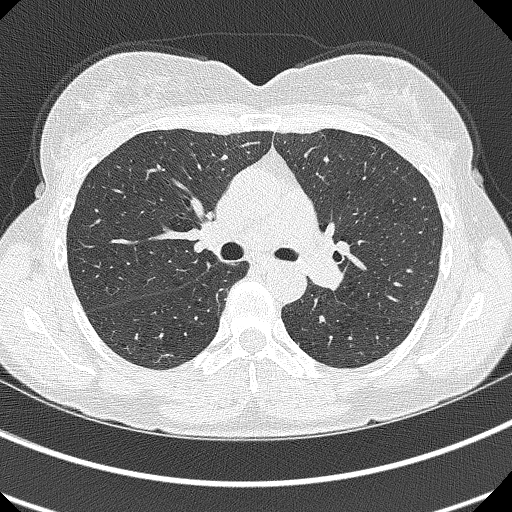
[im 245/359  mediastinal]
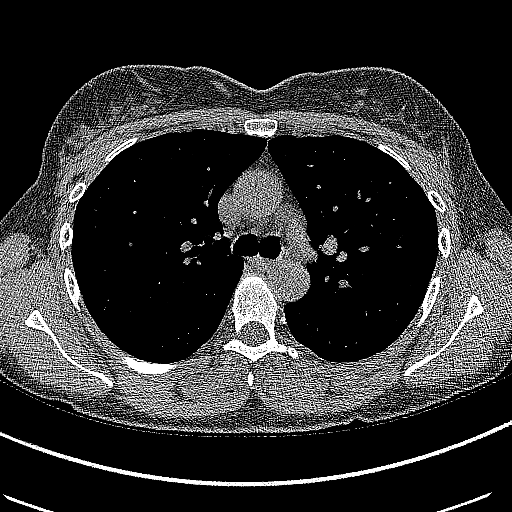
[im 245/359  lung]
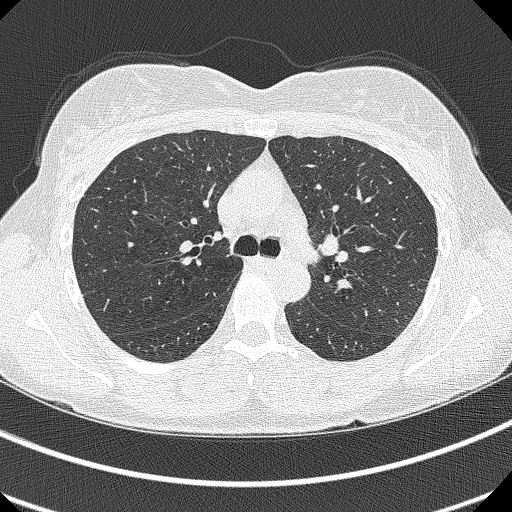
[im 277/359  lung]
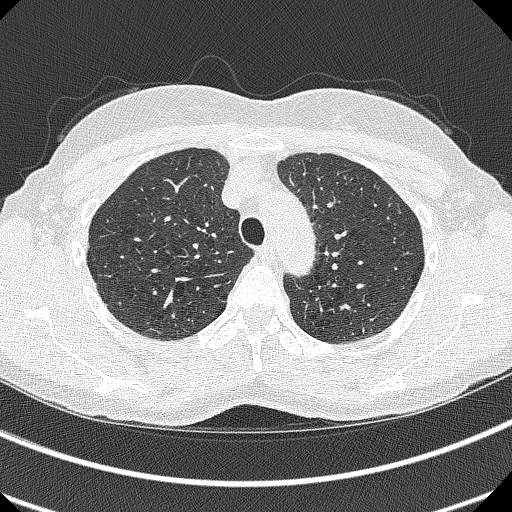
[im 310/359  lung]
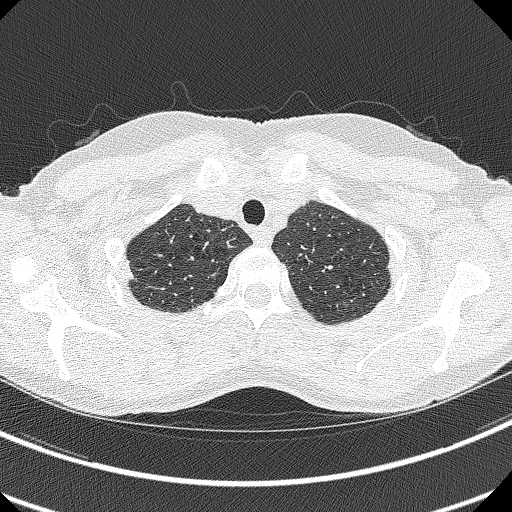
[im 342/359  lung]
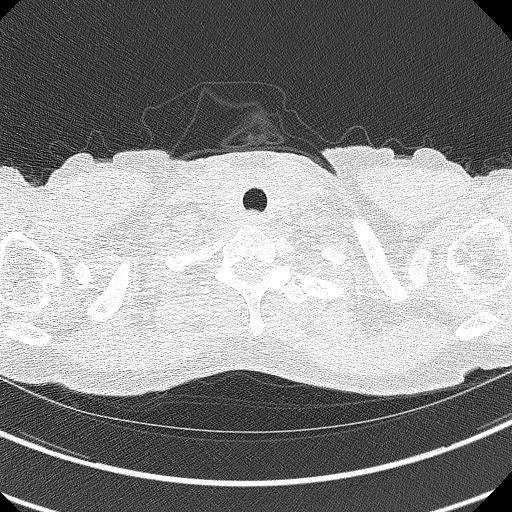

[Series 4: coronals lung 1.00 cor · coronal · 0.61mm/px · 3 of 234 slices shown]
[im 47/234  lung]
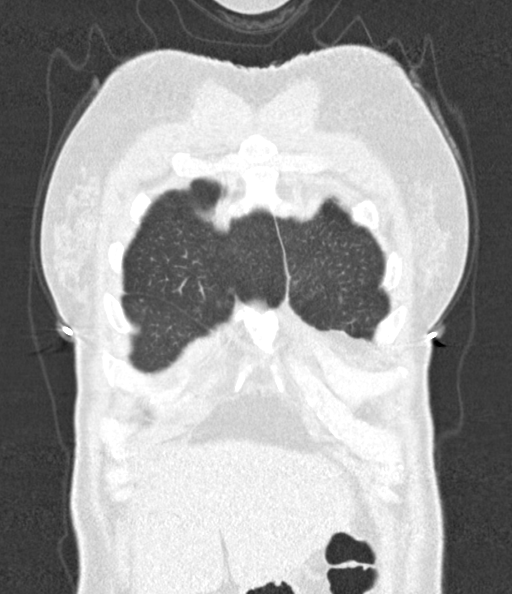
[im 94/234  lung]
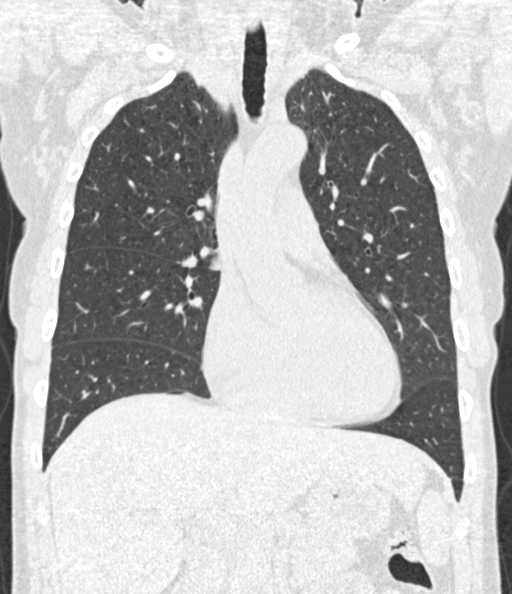
[im 140/234  lung]
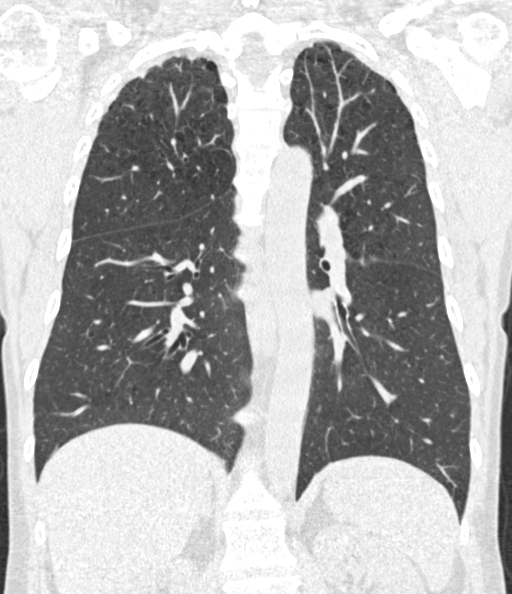

[15 of 40 positions shown; findings below may reference images not displayed]

FINDINGS: Cardiovascular: Heart size is normal. There is no significant
pericardial fluid, thickening or pericardial calcification. No
atherosclerotic calcifications in the thoracic aorta or the coronary
arteries.

Mediastinum/Nodes: No pathologically enlarged mediastinal or hilar
lymph nodes. Please note that accurate exclusion of hilar adenopathy
is limited on noncontrast CT scans. Esophagus is unremarkable in
appearance. No axillary lymphadenopathy.

Lungs/Pleura: Small pulmonary nodules are noted in the lungs,
largest of which is in the base of the left lower lobe (axial image
299 of series 3), with a volume derived mean diameter 5.4 mm. No
other larger more suspicious appearing pulmonary nodules or masses
are noted. No acute consolidative airspace disease. No pleural
effusions. Diffuse bronchial wall thickening with mild centrilobular
and paraseptal emphysema.

Upper Abdomen: Unremarkable.

Musculoskeletal: There are no aggressive appearing lytic or blastic
lesions noted in the visualized portions of the skeleton.
IMPRESSION: 1. Lung-RADS 2, benign appearance or behavior. Continue annual
screening with low-dose chest CT without contrast in 12 months.
2. Mild diffuse bronchial wall thickening with mild centrilobular
and paraseptal emphysema; imaging findings suggestive of underlying
COPD.

Emphysema ([id number]).

## 2022-07-07 ENCOUNTER — Ambulatory Visit: Payer: Self-pay | Admitting: *Deleted

## 2022-07-07 NOTE — Telephone Encounter (Signed)
Summary: bladder pressure   Patient states that she has had bladder pressure and urgency since Monday. Patient declined appointment this afternoon. She would like to know how to treat symptoms at home.     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 SYMPTOM: "What's the main symptom you're concerned about?" (e.g., frequency, incontinence)     Pressure to urinate 2. ONSET: "When did the  pressure  start?"     Monday 3. PAIN: "Is there any pain?" If Yes, ask: "How bad is it?" (Scale: 1-10; mild, moderate, severe)     Not at this time- feels may develop 4. CAUSE: "What do you think is causing the symptoms?"     UTI 5. OTHER SYMPTOMS: "Do you have any other symptoms?" (e.g., blood in urine, fever, flank pain, pain with urination)     Pressure, urgency  Protocols used: Urinary Symptoms-A-AH

## 2022-07-08 ENCOUNTER — Telehealth: Payer: Managed Care, Other (non HMO)

## 2022-07-08 DIAGNOSIS — R399 Unspecified symptoms and signs involving the genitourinary system: Secondary | ICD-10-CM | POA: Diagnosis not present

## 2022-07-08 MED ORDER — NITROFURANTOIN MONOHYD MACRO 100 MG PO CAPS: 100.0000 mg | ORAL_CAPSULE | Freq: Two times a day (BID) | ORAL | 0 refills | Status: AC

## 2022-07-08 NOTE — Progress Notes (Signed)
Virtual Visit Consent   Susan Calderon, you are scheduled for a virtual visit with a [facility] provider today. Just as with appointments in the office, your consent must be obtained to participate. Your consent will be active for this visit and any virtual visit you may have with one of our providers in the next 365 days. If you have a MyChart account, a copy of this consent can be sent to you electronically.  As this is a virtual visit, video technology does not allow for your provider to perform a traditional examination. This may limit your provider's ability to fully assess your condition. If your provider identifies any concerns that need to be evaluated in person or the need to arrange testing (such as labs, EKG, etc.), we will make arrangements to do so. Although advances in technology are sophisticated, we cannot ensure that it will always work on either your end or our end. If the connection with a video visit is poor, the visit may have to be switched to a telephone visit. With either a video or telephone visit, we are not always able to ensure that we have a secure connection.  By engaging in this virtual visit, you consent to the provision of healthcare and authorize for your insurance to be billed (if applicable) for the services provided during this visit. Depending on your insurance coverage, you may receive a charge related to this service.  I need to obtain your verbal consent now. Are you willing to proceed with your visit today? Latunya Kissick has provided verbal consent on 07/08/2022 for a virtual visit (video or telephone). Gildardo Pounds, NP  Date: 07/08/2022 8:55 AM  Virtual Visit via Video Note   I, Gildardo Pounds, connected with  Susan Calderon  ([id number], [birth year]) on 07/08/22 at  9:00 AM EST by a video-enabled telemedicine application and verified that I am speaking with the correct person using two identifiers.  Location: Patient: Virtual Visit Location Patient: Home Provider:  Virtual Visit Location Provider: Home Office   I discussed the limitations of evaluation and management by telemedicine and the availability of in person appointments. The patient expressed understanding and agreed to proceed.    History of Present Illness: Susan Calderon is a 57 y.o. who identifies as a female who was assigned female at birth, and is being seen today for UTI symptoms.   Ms. Mocarski notes symptoms pelvic pressure, dysuria, urinary frequency without chills, fever, hematuria or flank pain.  Onset 5 days ago.  She denies any other GU or vaginal symptoms.    Problems:  Patient Active Problem List   Diagnosis Date Noted   Cervicalgia 03/17/2022   Annual physical exam 11/10/2021   Cold intolerance of hand 07/01/2021   Raynaud's disease without gangrene 07/01/2021   Encounter for screening mammogram for malignant neoplasm of breast 07/01/2021   Chronic fatigue 07/01/2021   Hot flashes 03/28/2021   Need for influenza vaccination 03/28/2021   Constipation 03/28/2021   Dizziness 03/28/2021   Tendonitis of both shoulders 09/18/2017   Menopause 09/18/2017   Anxiety 09/18/2017   Bilateral hip bursitis 06/11/2015   COPD, mild ([place]) 03/26/2015   Rosacea 12/31/2014   Tobacco use disorder 12/31/2014   Allergic rhinitis 12/31/2014    Allergies:  Allergies  Allergen Reactions   Tree Extract     unknown   Medications:  Current Outpatient Medications:    nitrofurantoin, macrocrystal-monohydrate, (MACROBID) 100 MG capsule, Take 1 capsule (100 mg total) by mouth 2 (two) times  daily for 5 days., Disp: 10 capsule, Rfl: 0   amLODipine (NORVASC) 5 MG tablet, Take 1 tablet (5 mg total) by mouth daily. (Patient not taking: Reported on 04/21/2022), Disp: 30 tablet, Rfl: 5   cholecalciferol (VITAMIN D) 1000 UNITS tablet, Take 1,000 Units by mouth daily., Disp: , Rfl:    Cyanocobalamin (VITAMIN B 12 PO), Take 1 tablet by mouth daily., Disp: , Rfl:    Fish Oil-Cholecalciferol (FISH OIL + D3)  1000-1000 MG-UNIT CAPS, Take by mouth. (Patient not taking: Reported on 04/21/2022), Disp: , Rfl:    ibuprofen (ADVIL,MOTRIN) 200 MG tablet, Take 800 mg by mouth every 6 (six) hours as needed., Disp: , Rfl:    varenicline (CHANTIX) 0.5 MG tablet, Take 1 tablet (0.5 mg total) by mouth 2 (two) times daily., Disp: 60 tablet, Rfl: 1   vitamin C (ASCORBIC ACID) 500 MG tablet, Take 500 mg by mouth daily., Disp: , Rfl:    vitamin E 200 UNIT capsule, Take 200 Units by mouth daily., Disp: , Rfl:   Observations/Objective: Patient is well-developed, well-nourished in no acute distress.  Resting comfortably  at home.  Head is normocephalic, atraumatic.  No labored breathing.  Speech is clear and coherent with logical content.  Patient is alert and oriented at baseline.    Assessment and Plan: 1. UTI symptoms - nitrofurantoin, macrocrystal-monohydrate, (MACROBID) 100 MG capsule; Take 1 capsule (100 mg total) by mouth 2 (two) times daily for 5 days.  Dispense: 10 capsule; Refill: 0   Follow Up Instructions: I discussed the assessment and treatment plan with the patient. The patient was provided an opportunity to ask questions and all were answered. The patient agreed with the plan and demonstrated an understanding of the instructions.  A copy of instructions were sent to the patient via MyChart unless otherwise noted below.    The patient was advised to call back or seek an in-person evaluation if the symptoms worsen or if the condition fails to improve as anticipated.  Time:  I spent 11 minutes with the patient via telehealth technology discussing the above problems/concerns.    Gildardo Pounds, NP

## 2022-07-08 NOTE — Patient Instructions (Signed)
  Susan Calderon, thank you for joining Gildardo Pounds, NP for today's virtual visit.  While this provider is not your primary care provider (PCP), if your PCP is located in our provider database this encounter information will be shared with them immediately following your visit.   [facility] account gives you access to today's visit and all your visits, tests, and labs performed at [facility] " click here if you don't have a [facility] account or go to [email or web address]  Consent: (Patient) Susan Calderon provided verbal consent for this virtual visit at the beginning of the encounter.  Current Medications:  Current Outpatient Medications:    nitrofurantoin, macrocrystal-monohydrate, (MACROBID) 100 MG capsule, Take 1 capsule (100 mg total) by mouth 2 (two) times daily for 5 days., Disp: 10 capsule, Rfl: 0   amLODipine (NORVASC) 5 MG tablet, Take 1 tablet (5 mg total) by mouth daily. (Patient not taking: Reported on 04/21/2022), Disp: 30 tablet, Rfl: 5   cholecalciferol (VITAMIN D) 1000 UNITS tablet, Take 1,000 Units by mouth daily., Disp: , Rfl:    Cyanocobalamin (VITAMIN B 12 PO), Take 1 tablet by mouth daily., Disp: , Rfl:    Fish Oil-Cholecalciferol (FISH OIL + D3) 1000-1000 MG-UNIT CAPS, Take by mouth. (Patient not taking: Reported on 04/21/2022), Disp: , Rfl:    ibuprofen (ADVIL,MOTRIN) 200 MG tablet, Take 800 mg by mouth every 6 (six) hours as needed., Disp: , Rfl:    varenicline (CHANTIX) 0.5 MG tablet, Take 1 tablet (0.5 mg total) by mouth 2 (two) times daily., Disp: 60 tablet, Rfl: 1   vitamin C (ASCORBIC ACID) 500 MG tablet, Take 500 mg by mouth daily., Disp: , Rfl:    vitamin E 200 UNIT capsule, Take 200 Units by mouth daily., Disp: , Rfl:    Medications ordered in this encounter:  Meds ordered this encounter  Medications   nitrofurantoin, macrocrystal-monohydrate, (MACROBID) 100 MG capsule    Sig: Take 1 capsule (100 mg total) by mouth 2 (two)  times daily for 5 days.    Dispense:  10 capsule    Refill:  0    Order Specific Question:   Supervising Provider    Answer:   Chase Picket [id number]     *If you need refills on other medications prior to your next appointment, please contact your pharmacy*  Follow-Up: Call back or seek an in-person evaluation if the symptoms worsen or if the condition fails to improve as anticipated.  [facility] [phone]  Other Instructions Remember to wipe from front to back after urination   If you have been instructed to have an in-person evaluation today at a local Urgent Care facility, please use the link below. It will take you to a list of all of our available [facility], including address, phone number and hours of operation. Please do not delay care.  [facility]  If you or a family member do not have a primary care provider, use the link below to schedule a visit and establish care. When you choose a [facility] primary care physician or advanced practice provider, you gain a long-term partner in health. Find a Primary Care Provider  Learn more about [facility]'s in-office and virtual care options: [facility]

## 2022-08-01 ENCOUNTER — Telehealth: Payer: Self-pay | Admitting: Orthopedic Surgery

## 2022-08-01 NOTE — Telephone Encounter (Signed)
Please call to check on her.   How is her neck pain?   If pain is severe and is affecting her job/life or if she is having any balance issues or dexterity issues with her hands (cannot do buttons/zippers, dropping everything), then recommend she make follow up.

## 2022-08-01 NOTE — Telephone Encounter (Signed)
Left message to call back  

## 2022-08-03 NOTE — Telephone Encounter (Signed)
Left message to call back  

## 2022-08-09 NOTE — Telephone Encounter (Signed)
I was not able to reach her by phone. Left her a message to call the office. Do you want to send her a mychart message?

## 2022-11-16 ENCOUNTER — Ambulatory Visit (INDEPENDENT_AMBULATORY_CARE_PROVIDER_SITE_OTHER): Payer: Managed Care, Other (non HMO) | Admitting: Family Medicine

## 2022-11-16 ENCOUNTER — Encounter: Payer: Self-pay | Admitting: Family Medicine

## 2022-11-16 VITALS — BP 106/67 | HR 69 | Temp 98.0°F | Wt 139.0 lb

## 2022-11-16 DIAGNOSIS — N951 Menopausal and female climacteric states: Secondary | ICD-10-CM

## 2022-11-16 DIAGNOSIS — Z23 Encounter for immunization: Secondary | ICD-10-CM | POA: Diagnosis not present

## 2022-11-16 DIAGNOSIS — R739 Hyperglycemia, unspecified: Secondary | ICD-10-CM | POA: Diagnosis not present

## 2022-11-16 DIAGNOSIS — J449 Chronic obstructive pulmonary disease, unspecified: Secondary | ICD-10-CM | POA: Diagnosis not present

## 2022-11-16 DIAGNOSIS — Z1231 Encounter for screening mammogram for malignant neoplasm of breast: Secondary | ICD-10-CM

## 2022-11-16 DIAGNOSIS — F17200 Nicotine dependence, unspecified, uncomplicated: Secondary | ICD-10-CM

## 2022-11-16 DIAGNOSIS — Z0000 Encounter for general adult medical examination without abnormal findings: Secondary | ICD-10-CM | POA: Diagnosis not present

## 2022-11-16 MED ORDER — PAROXETINE HCL 10 MG PO TABS: 10.0000 mg | ORAL_TABLET | Freq: Every evening | ORAL | 1 refills | Status: AC

## 2022-11-16 NOTE — Patient Instructions (Signed)
Please call and schedule your mammogram:  [facility]  [address], [address] [facility] [place],  [place]  [address] Get Driving Directions Main: [phone]  The CDC recommends two doses of Shingrix (the new shingles vaccine) separated by 2 to 6 months for adults age 56 years and older. I recommend checking with your insurance plan regarding coverage for this vaccine.

## 2022-11-16 NOTE — Assessment & Plan Note (Signed)
Consented; VIS made available; no immediate side effects following administration; plan to repeat 2-6 months for 2/2

## 2022-11-16 NOTE — Assessment & Plan Note (Signed)
Chronic, stable Denies increased WOB or SOB; normal respiratory effort on RA No accessory muscle use Has reduced from 1.25 to 0.75 ppd Continues to work on reduction; may re-consider start of Chantix Due for LDLCT

## 2022-11-16 NOTE — Addendum Note (Signed)
Addended by: Janey Greaser D on: 11/16/2022 10:56 AM   Modules accepted: Orders

## 2022-11-16 NOTE — Assessment & Plan Note (Signed)
Chronic, worsening Will check hormone levels and trial paxil

## 2022-11-16 NOTE — Assessment & Plan Note (Signed)
Some home stressors as partner is planning to have colectomy? Reversal s/p SBO Things to do to keep yourself healthy  - Exercise at least 30-45 minutes a day, 3-4 days a week.  - Eat a low-fat diet with lots of fruits and vegetables, up to 7-9 servings per day.  - Seatbelts can save your life. Wear them always.  - Smoke detectors on every level of your home, check batteries every year.  - Eye Doctor - have an eye exam every 1-2 years  - Safe sex - if you may be exposed to STDs, use a condom.  - Alcohol -  If you drink, do it moderately, less than 2 drinks per day.  - Health Care Power of Attorney. Choose someone to speak for you if you are not able.  - Depression is common in our stressful world.If you're feeling down or losing interest in things you normally enjoy, please come in for a visit.  - Violence - If anyone is threatening or hurting you, please call immediately.

## 2022-11-16 NOTE — Assessment & Plan Note (Signed)
Recommend A1c Continue to recommend balanced, lower carb meals. Smaller meal size, adding snacks. Choosing water as drink of choice and increasing purposeful exercise.  

## 2022-11-16 NOTE — Progress Notes (Signed)
Complete physical exam   Patient: Susan Calderon   DOB: 11-Jun-1966   57 y.o. Female  MRN: [id number] Visit Date: 11/16/2022  Today's healthcare provider: Jacky Kindle, FNP  Re Introduced to nurse practitioner role and practice setting.  All questions answered.  Discussed provider/patient relationship and expectations.  Subjective    Susan Calderon is a 57 y.o. female who presents today for a complete physical exam.  She reports consuming a general diet. The patient does not participate in regular exercise at present. She generally feels well. She reports sleeping well. She does not have additional problems to discuss today.  HPI    Past Medical History:  Diagnosis Date   Allergy    Rosacea    Tobacco use disorder    Past Surgical History:  Procedure Laterality Date   BUNIONECTOMY     CESAREAN SECTION     COLONOSCOPY WITH PROPOFOL N/A 10/15/2017   Procedure: COLONOSCOPY WITH PROPOFOL;  Surgeon: Wyline Mood, MD;  Location: [facility] ENDOSCOPY;  Service: Gastroenterology;  Laterality: N/A;   TONSILLECTOMY     TUBAL LIGATION     Social History   Socioeconomic History   Marital status: Divorced    Spouse name: Not on file   Number of children: Not on file   Years of education: Not on file   Highest education level: Not on file  Occupational History   Not on file  Tobacco Use   Smoking status: Every Day    Packs/day: .75    Types: Cigarettes    Start date: 07/19/1981   Smokeless tobacco: Never  Vaping Use   Vaping Use: Never used  Substance and Sexual Activity   Alcohol use: Yes    Comment: social   Drug use: No   Sexual activity: Never  Other Topics Concern   Not on file  Social History Narrative   Not on file   Social Determinants of Health   Financial Resource Strain: Not on file  Food Insecurity: Not on file  Transportation Needs: Not on file  Physical Activity: Not on file  Stress: Not on file  Social Connections: Not on file  Intimate Partner Violence: Not on  file   Family Status  Relation Name Status   Mother  Alive   Father  Deceased at age 43   Sister  Alive   Brother  Alive   Daughter  Alive   Son  Alive   MGM  Deceased   MGF  Deceased   PGM  Deceased   PGF  Deceased   Brother  Alive   Brother  Alive   Brother  Alive   Sister  Alive   Neg Hx  (Not Specified)   Family History  Problem Relation Age of Onset   Hypothyroidism Mother    Osteoporosis Mother    Hypertension Mother    Heart attack Father    CAD Father    Stroke Maternal Grandmother    Breast cancer Neg Hx    Colon cancer Neg Hx    Allergies  Allergen Reactions   Tree Extract     unknown    Patient Care Team: Jacky Kindle, FNP as PCP - General (Family Medicine) Kieth Brightly, MD (General Surgery) Gabriel Cirri, NP as Nurse Practitioner (Nurse Practitioner)   Medications: Outpatient Medications Prior to Visit  Medication Sig Note   [DISCONTINUED] cholecalciferol (VITAMIN D) 1000 UNITS tablet Take 1,000 Units by mouth daily.    [DISCONTINUED] Cyanocobalamin (VITAMIN  B 12 PO) Take 1 tablet by mouth daily.    [DISCONTINUED] ibuprofen (ADVIL,MOTRIN) 200 MG tablet Take 800 mg by mouth every 6 (six) hours as needed.    [DISCONTINUED] varenicline (CHANTIX) 0.5 MG tablet Take 1 tablet (0.5 mg total) by mouth 2 (two) times daily.    [DISCONTINUED] vitamin C (ASCORBIC ACID) 500 MG tablet Take 500 mg by mouth daily.    [DISCONTINUED] vitamin E 200 UNIT capsule Take 200 Units by mouth daily.    [DISCONTINUED] amLODipine (NORVASC) 5 MG tablet Take 1 tablet (5 mg total) by mouth daily. 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 (Patient not taking: Reported on 04/21/2022)    No facility-administered medications prior to visit.    Review of Systems  Constitutional:  Positive for diaphoresis.  HENT: Negative.     Eyes: Negative.   Respiratory: Negative.    Cardiovascular: Negative.   Gastrointestinal: Negative.   Endocrine: Positive for cold intolerance.  Genitourinary: Negative.   Musculoskeletal:  Positive for neck pain and neck stiffness.  Skin: Negative.   Allergic/Immunologic: Positive for environmental allergies.  Neurological:  Positive for dizziness and numbness.  Hematological:  Bruises/bleeds easily.  Psychiatric/Behavioral: Negative.      Last CBC Lab Results  Component Value Date   WBC 5.1 07/01/2021   HGB 13.7 07/01/2021   HCT 40.0 07/01/2021   MCV 96 07/01/2021   MCH 32.7 07/01/2021   RDW 11.7 07/01/2021   PLT 247 07/01/2021   Last metabolic panel Lab Results  Component Value Date   GLUCOSE 90 07/01/2021   NA 138 07/01/2021   K 3.7 07/01/2021   CL 102 07/01/2021   CO2 23 07/01/2021   BUN 15 07/01/2021   CREATININE 0.85 07/01/2021   EGFR 81 07/01/2021   CALCIUM 9.4 07/01/2021   PROT 6.7 07/01/2021   ALBUMIN 4.6 07/01/2021   LABGLOB 2.1 07/01/2021   AGRATIO 2.2 07/01/2021   BILITOT 0.4 07/01/2021   ALKPHOS 84 07/01/2021   AST 17 07/01/2021   ALT 17 07/01/2021   ANIONGAP 4 (L) 04/27/2017   Last lipids Lab Results  Component Value Date   CHOL 155 11/10/2021   HDL 68 11/10/2021   LDLCALC 66 11/10/2021   TRIG 117 11/10/2021   CHOLHDL 2.3 11/10/2021   Last hemoglobin A1c No results found for: "HGBA1C" Last thyroid functions Lab Results  Component Value Date   TSH 1.530 07/01/2021   Last vitamin D Lab Results  Component Value Date   VD25OH 39.8 08/16/2016   Last vitamin B12 and Folate Lab Results  Component Value Date   VITAMINB12 1,979 (H) 05/14/2015      Objective    BP 106/67 (BP Location: Right Arm, Patient Position: Sitting, Cuff Size: Normal)   Pulse 69   Temp 98 F (36.7 C) (Oral)   Wt 139 lb (63 kg)   LMP 09/12/2017 Comment: Negative urine pregnancy 10-15-2017  SpO2 100%   BMI 23.13 kg/m  BP Readings from Last 3 Encounters:   11/16/22 106/67  04/21/22 118/78  03/17/22 115/68   Wt Readings from Last 3 Encounters:  11/16/22 139 lb (63 kg)  04/21/22 141 lb (64 kg)  03/17/22 138 lb (62.6 kg)   SpO2 Readings from Last 3 Encounters:  11/16/22 100%  03/17/22 100%  11/10/21 99%       Physical Exam Vitals and nursing note reviewed.  Constitutional:      General: She is awake. She is not in acute distress.    Appearance: Normal appearance. She is well-developed, well-groomed and normal weight. She is not ill-appearing, toxic-appearing or diaphoretic.  HENT:     Head: Normocephalic and atraumatic.     Jaw: There is normal jaw occlusion. No trismus, tenderness, swelling or pain on movement.     Right Ear: Hearing, tympanic membrane, ear canal and external ear normal. There is no impacted cerumen.     Left Ear: Hearing, tympanic membrane, ear canal and external ear normal. There is no impacted cerumen.     Nose: Nose normal. No congestion or rhinorrhea.     Right Turbinates: Not enlarged, swollen or pale.     Left Turbinates: Not enlarged, swollen or pale.     Right Sinus: No maxillary sinus tenderness or frontal sinus tenderness.     Left Sinus: No maxillary sinus tenderness or frontal sinus tenderness.     Mouth/Throat:     Lips: Pink.     Mouth: Mucous membranes are moist. No injury.     Tongue: No lesions.     Pharynx: Oropharynx is clear. Uvula midline. No pharyngeal swelling, oropharyngeal exudate, posterior oropharyngeal erythema or uvula swelling.     Tonsils: No tonsillar exudate or tonsillar abscesses.  Eyes:     General: Lids are normal. Lids are everted, no foreign bodies appreciated. Vision grossly intact. Gaze aligned appropriately. No allergic shiner or visual field deficit.       Right eye: No discharge.        Left eye: No discharge.     Extraocular Movements: Extraocular movements intact.     Conjunctiva/sclera: Conjunctivae normal.     Right eye: Right conjunctiva is not injected. No  exudate.    Left eye: Left conjunctiva is not injected. No exudate.    Pupils: Pupils are equal, round, and reactive to light.  Neck:     Thyroid: No thyroid mass, thyromegaly or thyroid tenderness.     Vascular: No carotid bruit.     Trachea: Trachea normal.  Cardiovascular:     Rate and Rhythm: Normal rate and regular rhythm.     Pulses: Normal pulses.          Carotid pulses are 2+ on the right side and 2+ on the left side.      Radial pulses are 2+ on the right side and 2+ on the left side.       Dorsalis pedis pulses are 2+ on the right side and 2+ on the left side.       Posterior tibial pulses are 2+ on the right side and 2+ on the left side.     Heart sounds: Normal heart sounds, S1 normal and S2 normal. No murmur heard.    No friction rub. No gallop.  Pulmonary:     Effort: Pulmonary effort is normal. No respiratory distress.     Breath sounds: Normal breath sounds and air entry. No stridor. No wheezing, rhonchi or rales.  Chest:     Chest wall: No tenderness.  Abdominal:     General: Abdomen is flat. Bowel sounds are normal. There is no distension.     Palpations: Abdomen is soft. There is no mass.     Tenderness: There is no abdominal tenderness. There is no right CVA tenderness, left CVA tenderness, guarding or rebound.     Hernia: No hernia is present.  Genitourinary:    Comments:  Exam deferred; denies complaints Musculoskeletal:        General: No swelling, tenderness, deformity or signs of injury. Normal range of motion.     Cervical back: Full passive range of motion without pain, normal range of motion and neck supple. No edema, rigidity or tenderness. No muscular tenderness.     Right lower leg: No edema.     Left lower leg: No edema.  Lymphadenopathy:     Cervical: No cervical adenopathy.     Right cervical: No superficial, deep or posterior cervical adenopathy.    Left cervical: No superficial, deep or posterior cervical adenopathy.  Skin:    General: Skin is  warm and dry.     Capillary Refill: Capillary refill takes less than 2 seconds.     Coloration: Skin is not jaundiced or pale.     Findings: No bruising, erythema, lesion or rash.  Neurological:     General: No focal deficit present.     Mental Status: She is alert and oriented to person, place, and time. Mental status is at baseline.     GCS: GCS eye subscore is 4. GCS verbal subscore is 5. GCS motor subscore is 6.     Sensory: Sensation is intact. No sensory deficit.     Motor: Motor function is intact. No weakness.     Coordination: Coordination is intact. Coordination normal.     Gait: Gait is intact. Gait normal.  Psychiatric:        Attention and Perception: Attention and perception normal.        Mood and Affect: Mood and affect normal.        Speech: Speech normal.        Behavior: Behavior normal. Behavior is cooperative.        Thought Content: Thought content normal.        Cognition and Memory: Cognition and memory normal.        Judgment: Judgment normal.     Last depression screening scores    11/16/2022    9:26 AM 03/17/2022    8:35 AM 11/10/2021    8:13 AM  PHQ 2/9 Scores  PHQ - 2 Score 0 0 0  PHQ- 9 Score 0 0 0   Last fall risk screening    11/16/2022    9:26 AM  Fall Risk   Falls in the past year? 0  Number falls in past yr: 0  Injury with Fall? 0   Last Audit-C alcohol use screening    11/16/2022    9:26 AM  Alcohol Use Disorder Test (AUDIT)  1. How often do you have a drink containing alcohol? 3  2. How many drinks containing alcohol do you have on a typical day when you are drinking? 0  3. How often do you have six or more drinks on one occasion? 1  AUDIT-C Score 4   A score of 3 or more in women, and 4 or more in men indicates increased risk for alcohol abuse, EXCEPT if all of the points are from question 1   No results found for any visits on 11/16/22.  Assessment & Plan    Routine Health Maintenance and Physical Exam  Exercise Activities and  Dietary recommendations  Goals   None     Immunization History  Administered Date(s) Administered   Influenza,inj,Quad PF,6+ Mos 03/28/2021, 03/17/2022   Influenza-Unspecified 03/22/2016   PFIZER(Purple Top)SARS-COV-2 Vaccination 10/24/2019, 11/21/2019   Pneumococcal Polysaccharide-23 03/11/2008   Td 03/21/2006  Tdap 04/28/2016    Health Maintenance  Topic Date Due   Zoster Vaccines- Shingrix (1 of 2) Never done   Lung Cancer Screening  03/26/2021   COVID-19 Vaccine (3 - 2023-24 season) 02/10/2022   INFLUENZA VACCINE  01/11/2023   MAMMOGRAM  08/24/2023   PAP SMEAR-Modifier  10/10/2023   DTaP/Tdap/Td (3 - Td or Tdap) 04/28/2026   Colonoscopy  10/16/2027   Hepatitis C Screening  Completed   HIV Screening  Completed   HPV VACCINES  Aged Out    Discussed health benefits of physical activity, and encouraged her to engage in regular exercise appropriate for her age and condition.  Problem List Items Addressed This Visit       Respiratory   COPD, mild (HCC)    Chronic, stable Denies increased WOB or SOB; normal respiratory effort on RA No accessory muscle use Has reduced from 1.25 to 0.75 ppd Continues to work on reduction; may re-consider start of Chantix Due for LDLCT      Relevant Orders   Ambulatory Referral Lung Cancer Screening [facility]     Other   Annual physical exam - Primary    Some home stressors as partner is planning to have colectomy? Reversal s/p SBO Things to do to keep yourself healthy  - Exercise at least 30-45 minutes a day, 3-4 days a week.  - Eat a low-fat diet with lots of fruits and vegetables, up to 7-9 servings per day.  - Seatbelts can save your life. Wear them always.  - Smoke detectors on every level of your home, check batteries every year.  - Eye Doctor - have an eye exam every 1-2 years  - Safe sex - if you may be exposed to STDs, use a condom.  - Alcohol -  If you drink, do it moderately, less than 2 drinks per day.  -  Health Care Power of Attorney. Choose someone to speak for you if you are not able.  - Depression is common in our stressful world.If you're feeling down or losing interest in things you normally enjoy, please come in for a visit.  - Violence - If anyone is threatening or hurting you, please call immediately.       Relevant Orders   CBC with Differential/Platelet   Comprehensive Metabolic Panel (CMET)   Lipid panel   TSH + free T4   Elevated blood sugar    Recommend A1c Continue to recommend balanced, lower carb meals. Smaller meal size, adding snacks. Choosing water as drink of choice and increasing purposeful exercise.       Relevant Orders   Hemoglobin A1c   Need for shingles vaccine    Consented; VIS made available; no immediate side effects following administration; plan to repeat 2-6 months for 2/2       Screening mammogram for breast cancer    Due for screening for mammogram, denies breast concerns, provided with phone number to call and schedule appointment for mammogram. Encouraged to repeat breast cancer screening every 1-2 years.       Relevant Orders   MM 3D SCREENING MAMMOGRAM BILATERAL BREAST   Tobacco use disorder    Chronic, improved Cigarettes       Vasomotor symptoms due to menopause    Chronic, worsening Will check hormone levels and trial paxil       Relevant Medications   PARoxetine (PAXIL) 10 MG tablet   Other Relevant Orders   FSH/LH   Estrogens, total   Estradiol  Return in about 8 weeks (around 01/11/2023) for immunization, nurse follow up- shingrix 2/2.    Leilani Merl, FNP, have reviewed all documentation for this visit. The documentation on 11/16/22 for the exam, diagnosis, procedures, and orders are all accurate and complete.  Jacky Kindle, FNP  [facility] [phone] (phone) [phone] (fax)  [facility]

## 2022-11-16 NOTE — Assessment & Plan Note (Signed)
Chronic, improved Cigarettes

## 2022-11-16 NOTE — Assessment & Plan Note (Signed)
Due for screening for mammogram, denies breast concerns, provided with phone number to call and schedule appointment for mammogram. Encouraged to repeat breast cancer screening every 1-2 years.  

## 2022-11-18 LAB — CBC WITH DIFFERENTIAL/PLATELET
Basophils Absolute: 0 10*3/uL (ref 0.0–0.2)
Basos: 0 %
EOS (ABSOLUTE): 0 10*3/uL (ref 0.0–0.4)
Eos: 0 %
Hematocrit: 38.8 % (ref 34.0–46.6)
Hemoglobin: 12.8 g/dL (ref 11.1–15.9)
Immature Grans (Abs): 0 10*3/uL (ref 0.0–0.1)
Immature Granulocytes: 0 %
Lymphocytes Absolute: 1.1 10*3/uL (ref 0.7–3.1)
Lymphs: 16 %
MCH: 31.9 pg (ref 26.6–33.0)
MCHC: 33 g/dL (ref 31.5–35.7)
MCV: 97 fL (ref 79–97)
Monocytes Absolute: 0.5 10*3/uL (ref 0.1–0.9)
Monocytes: 8 %
Neutrophils Absolute: 4.9 10*3/uL (ref 1.4–7.0)
Neutrophils: 76 %
Platelets: 246 10*3/uL (ref 150–450)
RBC: 4.01 x10E6/uL (ref 3.77–5.28)
RDW: 12.3 % (ref 11.7–15.4)
WBC: 6.6 10*3/uL (ref 3.4–10.8)

## 2022-11-18 LAB — COMPREHENSIVE METABOLIC PANEL
ALT: 19 IU/L (ref 0–32)
AST: 17 IU/L (ref 0–40)
Albumin/Globulin Ratio: 2.5 — ABNORMAL HIGH (ref 1.2–2.2)
Albumin: 4.7 g/dL (ref 3.8–4.9)
Alkaline Phosphatase: 76 IU/L (ref 44–121)
BUN/Creatinine Ratio: 20 (ref 9–23)
BUN: 16 mg/dL (ref 6–24)
Bilirubin Total: 0.4 mg/dL (ref 0.0–1.2)
CO2: 23 mmol/L (ref 20–29)
Calcium: 9.3 mg/dL (ref 8.7–10.2)
Chloride: 103 mmol/L (ref 96–106)
Creatinine, Ser: 0.79 mg/dL (ref 0.57–1.00)
Globulin, Total: 1.9 g/dL (ref 1.5–4.5)
Glucose: 104 mg/dL — ABNORMAL HIGH (ref 70–99)
Potassium: 3.6 mmol/L (ref 3.5–5.2)
Sodium: 138 mmol/L (ref 134–144)
Total Protein: 6.6 g/dL (ref 6.0–8.5)
eGFR: 88 mL/min/{1.73_m2} (ref >59)

## 2022-11-18 LAB — LIPID PANEL
Chol/HDL Ratio: 2.1 ratio (ref 0.0–4.4)
Cholesterol, Total: 164 mg/dL (ref 100–199)
HDL: 78 mg/dL (ref >39)
LDL Chol Calc (NIH): 70 mg/dL (ref 0–99)
Triglycerides: 90 mg/dL (ref 0–149)
VLDL Cholesterol Cal: 16 mg/dL (ref 5–40)

## 2022-11-18 LAB — FSH/LH
FSH: 43.4 m[IU]/mL
LH: 52 m[IU]/mL

## 2022-11-18 LAB — HEMOGLOBIN A1C
Est. average glucose Bld gHb Est-mCnc: 120 mg/dL
Hgb A1c MFr Bld: 5.8 % — ABNORMAL HIGH (ref 4.8–5.6)

## 2022-11-18 LAB — ESTROGENS, TOTAL: Estrogen: 74 pg/mL (ref 40–244)

## 2022-11-18 LAB — TSH+FREE T4
Free T4: 1.01 ng/dL (ref 0.82–1.77)
TSH: 1.03 u[IU]/mL (ref 0.450–4.500)

## 2022-11-18 LAB — ESTRADIOL: Estradiol: 19.7 pg/mL

## 2022-11-18 NOTE — Progress Notes (Signed)
Pre-diabetes now noted; Continue to recommend balanced, lower carb meals. Smaller meal size, adding snacks. Choosing water as drink of choice and increasing purposeful exercise. Labs confirm post menopause.

## 2022-11-21 ENCOUNTER — Telehealth: Payer: Self-pay

## 2022-11-21 NOTE — Telephone Encounter (Signed)
Pt given lab results per notes of E. Suzie Portela FNP on 11/21/22. Pt verbalized understanding. Pt would like paper copy of lab work mailed to her address. Address verified.

## 2022-11-23 ENCOUNTER — Ambulatory Visit: Payer: Self-pay

## 2022-11-23 NOTE — Telephone Encounter (Signed)
Reason for Disposition  Postmenopausal vaginal bleeding  Patient is HIGH RISK (e.g., age > 64 years, pregnant, HIV+, or chronic medical condition)  Answer Assessment - Initial Assessment Questions 1. AMOUNT: "Describe the bleeding that you are having." "How much bleeding is there?"    - SPOTTING: spotting, or pinkish / brownish mucous discharge; does not fill panty liner or pad    - MILD:  less than 1 pad / hour; less than patient's usual menstrual bleeding   - MODERATE: 1-2 pads / hour; 1 menstrual cup every 6 hours; small-medium blood clots (e.g., pea, grape, small coin)   - SEVERE: soaking 2 or more pads/hour for 2 or more hours; 1 menstrual cup every 2 hours; bleeding not contained by pads or continuous red blood from vagina; large blood clots (e.g., golf ball, large coin)      Spotting- brownish  2. ONSET: "When did the bleeding begin?" "Is it continuing now?"     2 days ago  3. MENOPAUSE: "When was your last menstrual period?"      4/19 4. ABDOMEN PAIN: "Do you have any pain?" "How bad is the pain?"  (e.g., Scale 1-10; mild, moderate, or severe)   - MILD (1-3): doesn't interfere with normal activities, abdomen soft and not tender to touch    - MODERATE (4-7): interferes with normal activities or awakens from sleep, abdomen tender to touch    - SEVERE (8-10): excruciating pain, doubled over, unable to do any normal activities      no 5. BLOOD THINNERS: "Do you take any blood thinners?" (e.g., Coumadin/warfarin, Pradaxa/dabigatran, aspirin)     no 6. HORMONE MEDICINES: "Are you taking any hormone medicines, prescription or OTC?" (e.g., birth control pills, estrogen)     7. CAUSE: "What do you think is causing the bleeding?" (e.g., recent gyn surgery, recent gyn procedure; known bleeding disorder, uterine cancer)       *No Answer* 8. HEMODYNAMIC STATUS: "Are you weak or feeling lightheaded?" If Yes, ask: "Can you stand and walk normally?"      yes 9. OTHER SYMPTOMS: "What other  symptoms are you having with the bleeding?" (e.g., back pain, burning with urination, fever)     no  Answer Assessment - Initial Assessment Questions 1. WORST SYMPTOM: "What is your worst symptom?" (e.g., cough, runny nose, muscle aches, headache, sore throat, fever)      Head feels "fuzzy' , runny nose (clear)  2. ONSET: "When did your flu symptoms start?"      Friday 3. COUGH: "How bad is the cough?"       No cough 4. RESPIRATORY DISTRESS: "Describe your breathing."      Normal no SOB 5. FEVER: "Do you have a fever?" If Yes, ask: "What is your temperature, how was it measured, and when did it start?"     no 6. EXPOSURE: "Were you exposed to someone with influenza?"       no 10. OTHER SYMPTOMS: "Do you have any other symptoms?"  (e.g., runny nose, muscle aches, headache, sore throat)       Runny nose, weakness  Protocols used: Vaginal Bleeding - Postmenopausal-A-AH, Influenza - Seasonal-A-AH

## 2022-11-29 ENCOUNTER — Ambulatory Visit: Payer: Managed Care, Other (non HMO) | Admitting: Family Medicine

## 2022-12-05 ENCOUNTER — Other Ambulatory Visit: Payer: Self-pay

## 2022-12-05 DIAGNOSIS — Z87891 Personal history of nicotine dependence: Secondary | ICD-10-CM

## 2022-12-05 DIAGNOSIS — Z122 Encounter for screening for malignant neoplasm of respiratory organs: Secondary | ICD-10-CM

## 2022-12-05 DIAGNOSIS — F17210 Nicotine dependence, cigarettes, uncomplicated: Secondary | ICD-10-CM

## 2022-12-20 ENCOUNTER — Ambulatory Visit
Admission: RE | Admit: 2022-12-20 | Discharge: 2022-12-20 | Disposition: A | Discharge status: Home / Self Care | Payer: Managed Care, Other (non HMO) | Source: Ambulatory Visit | Attending: Family Medicine | Admitting: Family Medicine

## 2022-12-20 DIAGNOSIS — Z1231 Encounter for screening mammogram for malignant neoplasm of breast: Secondary | ICD-10-CM | POA: Diagnosis present

## 2022-12-20 NOTE — Progress Notes (Signed)
Hi Susan Calderon  Normal mammogram; repeat in 1 year.  Please let us know if you have any questions.  Thank you,  Susan Norton, FNP

## 2023-01-16 ENCOUNTER — Encounter: Payer: Self-pay | Admitting: Acute Care

## 2023-01-16 ENCOUNTER — Ambulatory Visit (INDEPENDENT_AMBULATORY_CARE_PROVIDER_SITE_OTHER): Payer: Managed Care, Other (non HMO) | Admitting: Acute Care

## 2023-01-16 DIAGNOSIS — F17210 Nicotine dependence, cigarettes, uncomplicated: Secondary | ICD-10-CM | POA: Diagnosis not present

## 2023-01-16 NOTE — Patient Instructions (Signed)

## 2023-01-16 NOTE — Progress Notes (Signed)
Virtual Visit via Telephone Note  I connected with Susan Calderon on 01/16/23 at  4:00 PM EDT by telephone and verified that I am speaking with the correct person using two identifiers.  Location: Patient:  At home Provider:  [address], [place], [place], [address]    I discussed the limitations, risks, security and privacy concerns of performing an evaluation and management service by telephone and the availability of in person appointments. I also discussed with the patient that there may be a patient responsible charge related to this service. The patient expressed understanding and agreed to proceed.   Shared Decision Making Visit Lung Cancer Screening Program [phone])   Eligibility: Age 57 y.o. Pack Years Smoking History Calculation 41 pack year smoking history (# packs/per year x # years smoked) Recent History of coughing up blood  no Unexplained weight loss? no ( >Than 15 pounds within the last 6 months ) Prior History Lung / other cancer no (Diagnosis within the last 5 years already requiring surveillance chest CT Scans). Smoking Status Current Smoker Former Smokers: Years since quit:  NA  Quit Date:  NA  Visit Components: Discussion included one or more decision making aids. yes Discussion included risk/benefits of screening. yes Discussion included potential follow up diagnostic testing for abnormal scans. yes Discussion included meaning and risk of over diagnosis. yes Discussion included meaning and risk of False Positives. yes Discussion included meaning of total radiation exposure. yes  Counseling Included: Importance of adherence to annual lung cancer LDCT screening. yes Impact of comorbidities on ability to participate in the program. yes Ability and willingness to under diagnostic treatment. yes  Smoking Cessation Counseling: Current Smokers:  Discussed importance of smoking cessation. yes Information about tobacco cessation classes and interventions  provided to patient. yes Patient provided with "ticket" for LDCT Scan. yes Symptomatic Patient. no  Counseling NA Diagnosis Code: Tobacco Use Z72.0 Asymptomatic Patient yes  Counseling (Intermediate counseling: > three minutes counseling) U0454 Former Smokers:  Discussed the importance of maintaining cigarette abstinence. yes Diagnosis Code: Personal History of Nicotine Dependence. U98.119 Information about tobacco cessation classes and interventions provided to patient. Yes Patient provided with "ticket" for LDCT Scan. yes Written Order for Lung Cancer Screening with LDCT placed in Epic. Yes (CT Chest Lung Cancer Screening Low Dose W/O CM) [id number] Z12.2-Screening of respiratory organs Z87.891-Personal history of nicotine dependence  I have spent 25 minutes of face to face/ virtual visit   time with  [name] discussing the risks and benefits of lung cancer screening. We viewed / discussed a power point together that explained in detail the above noted topics. We paused at intervals to allow for questions to be asked and answered to ensure understanding.We discussed that the single most powerful action that she can take to decrease her risk of developing lung cancer is to quit smoking. We discussed whether or not she is ready to commit to setting a quit date. We discussed options for tools to aid in quitting smoking including nicotine replacement therapy, non-nicotine medications, support groups, Quit Smart classes, and behavior modification. We discussed that often times setting smaller, more achievable goals, such as eliminating 1 cigarette a day for a week and then 2 cigarettes a day for a week can be helpful in slowly decreasing the number of cigarettes smoked. This allows for a sense of accomplishment as well as providing a clinical benefit. I provided  her  with smoking cessation  information  with contact information for community resources, classes,  free nicotine replacement therapy, and  access to mobile apps, text messaging, and on-line smoking cessation help. I have also provided  her  the office contact information in the event she needs to contact me, or the screening staff. We discussed the time and location of the scan, and that either Abigail Miyamoto RN, Karlton Lemon, RN  or I will call / send a letter with the results within 24-72 hours of receiving them. The patient verbalized understanding of all of  the above and had no further questions upon leaving the office. They have my contact information in the event they have any further questions.  I spent 3-4 minutes counseling on smoking cessation and the health risks of continued tobacco abuse.  I explained to the patient that there has been a high incidence of coronary artery disease noted on these exams. I explained that this is a non-gated exam therefore degree or severity cannot be determined. This patient is not on statin therapy. I have asked the patient to follow-up with their PCP regarding any incidental finding of coronary artery disease and management with diet or medication as their PCP  feels is clinically indicated. The patient verbalized understanding of the above and had no further questions upon completion of the visit.      Bevelyn Ngo, NP 01/16/2023

## 2023-01-18 ENCOUNTER — Ambulatory Visit
Admission: RE | Admit: 2023-01-18 | Discharge: 2023-01-18 | Disposition: A | Discharge status: Home / Self Care | Payer: Managed Care, Other (non HMO) | Source: Ambulatory Visit | Attending: Acute Care | Admitting: Acute Care

## 2023-01-18 ENCOUNTER — Ambulatory Visit: Payer: Managed Care, Other (non HMO)

## 2023-01-18 DIAGNOSIS — Z122 Encounter for screening for malignant neoplasm of respiratory organs: Secondary | ICD-10-CM

## 2023-01-18 DIAGNOSIS — Z87891 Personal history of nicotine dependence: Secondary | ICD-10-CM

## 2023-01-18 DIAGNOSIS — F17210 Nicotine dependence, cigarettes, uncomplicated: Secondary | ICD-10-CM

## 2023-01-23 ENCOUNTER — Other Ambulatory Visit: Payer: Self-pay

## 2023-01-23 DIAGNOSIS — Z122 Encounter for screening for malignant neoplasm of respiratory organs: Secondary | ICD-10-CM

## 2023-01-23 DIAGNOSIS — F17210 Nicotine dependence, cigarettes, uncomplicated: Secondary | ICD-10-CM

## 2023-01-23 DIAGNOSIS — Z87891 Personal history of nicotine dependence: Secondary | ICD-10-CM

## 2023-01-25 ENCOUNTER — Ambulatory Visit: Payer: Managed Care, Other (non HMO) | Admitting: Family Medicine

## 2023-01-31 ENCOUNTER — Ambulatory Visit (INDEPENDENT_AMBULATORY_CARE_PROVIDER_SITE_OTHER): Payer: Managed Care, Other (non HMO) | Admitting: Family Medicine

## 2023-01-31 DIAGNOSIS — Z23 Encounter for immunization: Secondary | ICD-10-CM | POA: Diagnosis not present

## 2023-01-31 NOTE — Progress Notes (Signed)
Shingrix provided by CMA: series complete.  Patient tolerated well.  No PE completed by APP.  Jacky Kindle, FNP  [facility] [address] [place], [place] [address] [phone] (phone) [phone] (fax) [facility]

## 2023-02-13 ENCOUNTER — Ambulatory Visit: Payer: Self-pay

## 2023-02-13 ENCOUNTER — Telehealth: Payer: Self-pay | Admitting: Family Medicine

## 2023-02-13 ENCOUNTER — Other Ambulatory Visit: Payer: Self-pay | Admitting: Family Medicine

## 2023-02-13 MED ORDER — FLUTICASONE-SALMETEROL 100-50 MCG/ACT IN AEPB: 1.0000 | INHALATION_SPRAY | Freq: Two times a day (BID) | RESPIRATORY_TRACT | 3 refills | Status: DC

## 2023-02-13 NOTE — Telephone Encounter (Signed)
[facility] is requesting prior authorization Key: [id number] Name: [place] Fluticasone- Salmeterol 100-50 MCG/ACT Aerosol Powder

## 2023-02-13 NOTE — Telephone Encounter (Signed)
  Chief Complaint: Difficulty breathing Symptoms: has trouble breathing when lying down - cough Frequency: under a month Pertinent Negatives: Patient denies  Disposition: [] ED /[] Urgent Care (no appt availability in office) / [] Appointment(In office/virtual)/ []  [facility]/ [] Home Care/ [x] Refused Recommended Disposition /[] [facility]/ []  Follow-up with PCP Additional Notes: Pt states that she was recently diagnosed with emphysema, and COPD. PT does not want to be seen if office, she would like an inhaler called into [facility].  PT has not yet quit smoking, but is trying.    Reason for Disposition  [1] MODERATE longstanding difficulty breathing (e.g., speaks in phrases, SOB even at rest, pulse 100-120) AND [2] SAME as normal  Answer Assessment - Initial Assessment Questions 1. RESPIRATORY STATUS: "Describe your breathing?" (e.g., wheezing, shortness of breath, unable to speak, severe coughing)      Shortness of breath 2. ONSET: "When did this breathing problem begin?"      1 months ago 3. PATTERN "Does the difficult breathing come and go, or has it been constant since it started?"      Comes and goes when laying down 4. SEVERITY: "How bad is your breathing?" (e.g., mild, moderate, severe)    - MILD: No SOB at rest, mild SOB with walking, speaks normally in sentences, can lie down, no retractions, pulse < 100.    - MODERATE: SOB at rest, SOB with minimal exertion and prefers to sit, cannot lie down flat, speaks in phrases, mild retractions, audible wheezing, pulse 100-120.    - SEVERE: Very SOB at rest, speaks in single words, struggling to breathe, sitting hunched forward, retractions, pulse > 120      Mild moderate 5. RECURRENT SYMPTOM: "Have you had difficulty breathing before?" If Yes, ask: "When was the last time?" and "What happened that time?"      yes 6. CARDIAC HISTORY: "Do you have any history of heart disease?" (e.g., heart attack, angina, bypass  surgery, angioplasty)      no 7. LUNG HISTORY: "Do you have any history of lung disease?"  (e.g., pulmonary embolus, asthma, emphysema)     COPD, emphysema 8. CAUSE: "What do you think is causing the breathing problem?"      COPD, emphysema 9. OTHER SYMPTOMS: "Do you have any other symptoms? (e.g., dizziness, runny nose, cough, chest pain, fever)     No - cough  Protocols used: Breathing Difficulty-A-AH

## 2023-02-13 NOTE — Telephone Encounter (Signed)
Patient advised.

## 2023-02-14 ENCOUNTER — Other Ambulatory Visit: Payer: Self-pay | Admitting: Family Medicine

## 2023-02-14 MED ORDER — FLUTICASONE-SALMETEROL 115-21 MCG/ACT IN AERO: 2.0000 | INHALATION_SPRAY | Freq: Two times a day (BID) | RESPIRATORY_TRACT | 12 refills | Status: AC

## 2023-02-14 NOTE — Telephone Encounter (Signed)
[facility]  Request new Rx if alternative is chosen: Advair HFA, Engineer, materials, Symbicort

## 2023-02-16 ENCOUNTER — Telehealth: Payer: Self-pay | Admitting: Family Medicine

## 2023-02-16 NOTE — Telephone Encounter (Signed)
PA initiated

## 2023-02-16 NOTE — Telephone Encounter (Signed)
Patient called stated the fluticasone-salmeterol (ADVAIR [facility]) 115-21 MCG/ACT inhaler was ot covered by her insurance and is requesting provider call her in another inhaler that is covered by her insurance. Please f/u with patient

## 2023-02-19 NOTE — Telephone Encounter (Signed)
Outcome Denied on September 7 by [employer] 2017 [employer] Request Reference Number: [id number]. FLUTIC/SALME AER 100/50 is denied for not meeting the prior authorization requirement(s). Details of this decision are in the notice attached below or have been faxed to you. Drug Fluticasone-Salmeterol 100-50MCG/ACT aerosol powde

## 2023-04-12 ENCOUNTER — Ambulatory Visit: Payer: Self-pay

## 2023-04-12 NOTE — Telephone Encounter (Signed)
Detailed VM left per DPR. CRM created. Ok for [facility] to advise/clarify if patient returns call

## 2023-04-12 NOTE — Telephone Encounter (Signed)
Patient called, left VM to return the call to the office to speak to the NT.  Unable to reach patient after 3 attempts by [facility] NT, routing to the provider for resolution per protocol.   Summary: wants to know if she had flu shot for the year   Patient would like to know if she received a flu shot this year, she does not remember, she knows she had a shingles vaccination but wants to know if she had her flu shot?  Patients callback #[phone]

## 2023-04-12 NOTE — Telephone Encounter (Signed)
Patient called, left VM to return the call to the office to speak to the NT.    Last Flu shot was given 03/17/22 per immunization history on file in EPIC.  Summary: wants to know if she had flu shot for the year   Patient would like to know if she received a flu shot this year, she does not remember, she knows she had a shingles vaccination but wants to know if she had her flu shot?  Patients callback #[phone]

## 2023-04-12 NOTE — Telephone Encounter (Signed)
2nd attempt, Patient called, left VM to return the call to the office to speak to the NT.   Summary: wants to know if she had flu shot for the year   Patient would like to know if she received a flu shot this year, she does not remember, she knows she had a shingles vaccination but wants to know if she had her flu shot?  Patients callback #[phone]

## 2023-04-27 ENCOUNTER — Telehealth: Payer: Managed Care, Other (non HMO) | Admitting: Physician Assistant

## 2023-04-27 ENCOUNTER — Ambulatory Visit: Payer: Self-pay

## 2023-04-27 DIAGNOSIS — L255 Unspecified contact dermatitis due to plants, except food: Secondary | ICD-10-CM | POA: Diagnosis not present

## 2023-04-27 MED ORDER — TRIAMCINOLONE ACETONIDE 0.025 % EX OINT: 1.0000 | TOPICAL_OINTMENT | Freq: Two times a day (BID) | CUTANEOUS | 0 refills | Status: AC

## 2023-04-27 MED ORDER — PREDNISONE 10 MG (21) PO TBPK: ORAL_TABLET | ORAL | 0 refills | Status: AC

## 2023-04-27 NOTE — Patient Instructions (Signed)
Guss Bunde, thank you for joining Margaretann Loveless, PA-C for today's virtual visit.  While this provider is not your primary care provider (PCP), if your PCP is located in our provider database this encounter information will be shared with them immediately following your visit.   A [facility] MyChart account gives you access to today's visit and all your visits, tests, and labs performed at [facility] " click here if you don't have a [facility] MyChart account or go to [email or web address]  Consent: (Patient) Susan Calderon provided verbal consent for this virtual visit at the beginning of the encounter.  Current Medications:  Current Outpatient Medications:    predniSONE (STERAPRED UNI-PAK 21 TAB) 10 MG (21) TBPK tablet, 6 day taper; take as directed on package instructions, Disp: 21 tablet, Rfl: 0   triamcinolone (KENALOG) 0.025 % ointment, Apply 1 Application topically 2 (two) times daily., Disp: 30 g, Rfl: 0   fluticasone-salmeterol (ADVAIR HFA) 115-21 MCG/ACT inhaler, Inhale 2 puffs into the lungs 2 (two) times daily., Disp: 1 each, Rfl: 12   PARoxetine (PAXIL) 10 MG tablet, Take 1 tablet (10 mg total) by mouth at bedtime., Disp: 90 tablet, Rfl: 1   Medications ordered in this encounter:  Meds ordered this encounter  Medications   predniSONE (STERAPRED UNI-PAK 21 TAB) 10 MG (21) TBPK tablet    Sig: 6 day taper; take as directed on package instructions    Dispense:  21 tablet    Refill:  0    Order Specific Question:   Supervising Provider    Answer:   Merrilee Jansky [[id number]]   triamcinolone (KENALOG) 0.025 % ointment    Sig: Apply 1 Application topically 2 (two) times daily.    Dispense:  30 g    Refill:  0    Order Specific Question:   Supervising Provider    Answer:   Merrilee Jansky [id number]     *If you need refills on other medications prior to your next appointment, please contact your pharmacy*  Follow-Up: Call back or seek an in-person  evaluation if the symptoms worsen or if the condition fails to improve as anticipated.   Virtual Care [phone]  Other Instructions Poison Oak Dermatitis  Poison oak dermatitis is inflammation of the skin that is caused by contact with the chemicals in the leaves of the poison oak (Toxicodendron) plant. The skin reaction often includes redness, swelling, blisters, and extreme itching. What are the causes? This condition is caused by a specific chemical (urushiol) that is found in the sap of the poison oak plant. This chemical is sticky and can be easily spread to people, animals, and objects. You can get poison oak dermatitis by: Having direct contact with a poison oak plant. Touching animals, other people, or objects that have come in contact with poison oak and have the chemical on them. What increases the risk? This condition is more likely to develop in people who: Are outdoors often in wooded or [place] areas. Go outdoors without wearing protective clothing, such as closed shoes, long pants, and a long-sleeved shirt. What are the signs or symptoms? Symptoms of this condition include: Redness of the skin. Extreme itching. A rash that often includes bumps and blisters. The rash usually appears 48 hours after exposure if you have been exposed before. If this is the first time you have been exposed, the rash may not appear until a week after exposure. Swelling. This may occur if the reaction is more severe.  Symptoms usually last for 1-2 weeks. However, the first time you develop this condition, symptoms may last 3-4 weeks. How is this diagnosed? This condition may be diagnosed based on your symptoms and a physical exam. Your health care provider may also ask you about any recent outdoor activity. How is this treated? Treatment for this condition will vary depending on how severe it is. Treatment may include: Hydrocortisone creams or calamine lotions to relieve  itching. Oatmeal baths to soothe the skin. Over-the-counter antihistamine medicines to help reduce itching. Steroid medicine taken by mouth (orally) for more severe reactions. Follow these instructions at home: Medicines Take or apply over-the-counter and prescription medicines only as told by your health care provider. Use hydrocortisone creams or calamine lotion as needed to soothe the skin and relieve itching. General instructions Do not scratch or rub your skin. Apply a cold, wet cloth (cold compress) to the affected areas or take baths in cool water. This will help with itching. Avoid hot baths and showers. Take oatmeal baths as needed. Use colloidal oatmeal. You can get this at your local pharmacy or grocery store. Follow the instructions on the packaging. Wash clothes, bedsheets, towels, and blankets that you wore or came in contact with between your exposure to the plant and the appearance of your rash. The oils can remain on these items and continue to cause new exposure. Check the affected area every day for signs of infection. Check for: More redness, swelling, or pain. Fluid or blood. Warmth. Pus or a bad smell. Keep all follow-up visits. Your health care provider may want to see how your skin is progressing with treatment. How is this prevented?  Learn to identify the poison oak plant and avoid contact with the plant. This plant can be recognized by the number of leaves. Generally, poison oak has three leaves with flowering branches on a single stem. The leaves are often a bit fuzzy and have a toothlike edge. If you have been exposed to poison oak, thoroughly wash your skin with soap and water right away. You have about 30 minutes to remove the plant resin before it will cause the rash. Be sure to wash under your fingernails because any plant resin there will continue to spread the rash. When hiking or camping, wear clothes that will help you avoid exposure on the skin. This  includes long pants, a long-sleeved shirt, long socks, and hiking boots. You can also apply preventive lotion to your skin to help limit exposure. If you suspect that your clothes or outdoor gear came in contact with poison oak, rinse them off outside with a garden hose before bringing them inside your house. When doing yard work or gardening, wear gloves, long sleeves, long pants, and boots. Wash your garden tools and gloves if they come in contact with poison oak. If you suspect that your pet has come into contact with poison oak, wash them with pet shampoo and water. Make sure you wear gloves while washing your pet. Do not burn poison oak plants. This can release the chemical from the plant into the air and may cause a reaction on the skin or eyes, or in the lungs from breathing in the smoke. Contact a health care provider if: You have open sores in the rash area. You have any signs of infection. You have redness that spreads beyond the rash area. You have a fever. You have a rash over a large area of your body. You have a rash on your eyes,  mouth, or genitals. You have a rash that does not improve after a few weeks. Get help right away if: Your face swells or your eyes swell shut. You have trouble breathing. You have trouble swallowing. These symptoms may be an emergency. Get help right away. Call 911. Do not wait to see if the symptoms will go away. Do not drive yourself to the hospital. This information is not intended to replace advice given to you by your health care provider. Make sure you discuss any questions you have with your health care provider. Document Revised: 12/07/2021 Document Reviewed: 10/27/2021 Elsevier Patient Education  2024 Elsevier Inc.    If you have been instructed to have an in-person evaluation today at a local Urgent Care facility, please use the link below. It will take you to a list of all of our available [facility], including address,  phone number and hours of operation. Please do not delay care.  [facility]  If you or a family member do not have a primary care provider, use the link below to schedule a visit and establish care. When you choose a [facility] primary care physician or advanced practice provider, you gain a long-term partner in health. Find a Primary Care Provider  Learn more about [facility]'s in-office and virtual care options: [facility] - Get Care Now

## 2023-04-27 NOTE — Progress Notes (Signed)
Virtual Visit Consent   [name], you are scheduled for a virtual visit with a [facility] provider today. Just as with appointments in the office, your consent must be obtained to participate. Your consent will be active for this visit and any virtual visit you may have with one of our providers in the next 365 days. If you have a MyChart account, a copy of this consent can be sent to you electronically.  As this is a virtual visit, video technology does not allow for your provider to perform a traditional examination. This may limit your provider's ability to fully assess your condition. If your provider identifies any concerns that need to be evaluated in person or the need to arrange testing (such as labs, EKG, etc.), we will make arrangements to do so. Although advances in technology are sophisticated, we cannot ensure that it will always work on either your end or our end. If the connection with a video visit is poor, the visit may have to be switched to a telephone visit. With either a video or telephone visit, we are not always able to ensure that we have a secure connection.  By engaging in this virtual visit, you consent to the provision of healthcare and authorize for your insurance to be billed (if applicable) for the services provided during this visit. Depending on your insurance coverage, you may receive a charge related to this service.  I need to obtain your verbal consent now. Are you willing to proceed with your visit today? [name] has provided verbal consent on 04/27/2023 for a virtual visit (video or telephone). Margaretann Loveless, PA-C  Date: 04/27/2023 5:19 PM  Virtual Visit via Video Note   I, Margaretann Loveless, connected with  Susan Calderon  ([id number], 10-17-1965) on 04/27/23 at  5:15 PM EST by a video-enabled telemedicine application and verified that I am speaking with the correct person using two identifiers.  Location: Patient: Virtual Visit Location Patient:  Home Provider: Virtual Visit Location Provider: Home Office   I discussed the limitations of evaluation and management by telemedicine and the availability of in person appointments. The patient expressed understanding and agreed to proceed.    History of Present Illness: Susan Calderon is a 57 y.o. who identifies as a female who was assigned female at birth, and is being seen today for poison oak/ivy/sumac rash.  HPI: Rash This is a new problem. The current episode started in the past 7 days (exposed Sunday, rash started Tuesday). The problem has been gradually worsening since onset. The affected locations include the face and neck. The rash is characterized by blistering, itchiness and redness. She was exposed to plant contact. Pertinent negatives include no anorexia, congestion, cough, eye pain, facial edema, fatigue, fever, shortness of breath or sore throat. Past treatments include anti-itch cream (alcohol wipe, calamine spray, vinegar). The treatment provided no relief. There is no history of eczema.     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   Tobacco use disorder 12/31/2014    Allergies:  Allergies  Allergen Reactions   Tree Extract     unknown   Medications:  Current Outpatient Medications:    predniSONE (STERAPRED UNI-PAK 21 TAB) 10 MG (21) TBPK tablet,  6 day taper; take as directed on package instructions, Disp: 21 tablet, Rfl: 0   triamcinolone (KENALOG) 0.025 % ointment, Apply 1 Application topically 2 (two) times daily., Disp: 30 g, Rfl: 0   fluticasone-salmeterol (ADVAIR HFA) 115-21 MCG/ACT inhaler, Inhale 2 puffs into the lungs 2 (two) times daily., Disp: 1 each, Rfl: 12   PARoxetine (PAXIL) 10 MG tablet, Take 1  tablet (10 mg total) by mouth at bedtime., Disp: 90 tablet, Rfl: 1  Observations/Objective: Patient is well-developed, well-nourished in no acute distress.  Resting comfortably at home.  Head is normocephalic, atraumatic.  No labored breathing.  Speech is clear and coherent with logical content.  Patient is alert and oriented at baseline.  On the left cheek, jaw-line, and left neck  Assessment and Plan: 1. Dermatitis due to plants, including poison ivy, sumac, and oak - predniSONE (STERAPRED UNI-PAK 21 TAB) 10 MG (21) TBPK tablet; 6 day taper; take as directed on package instructions  Dispense: 21 tablet; Refill: 0 - triamcinolone (KENALOG) 0.025 % ointment; Apply 1 Application topically 2 (two) times daily.  Dispense: 30 g; Refill: 0  - Plant exposure (poison ivy/poison oak/poison sumac) with rash - Will prescribe Prednisone 6 day taper and Triamcinolone for face - May use topical Hydrocortisone cream, benadryl cream, and/or calamine lotion for itching - Cool compresses - Luke warm to cool showers - Seek in person evaluation if rash continues to spread or if any appear to become infected   Follow Up Instructions: I discussed the assessment and treatment plan with the patient. The patient was provided an opportunity to ask questions and all were answered. The patient agreed with the plan and demonstrated an understanding of the instructions.  A copy of instructions were sent to the patient via MyChart unless otherwise noted below.    The patient was advised to call back or seek an in-person evaluation if the symptoms worsen or if the condition fails to improve as anticipated.    Margaretann Loveless, PA-C

## 2023-04-27 NOTE — Telephone Encounter (Signed)
Chief Complaint: Poison Sumac rash on face and neck Symptoms: itching 6-7/10, tiny red/blister rash Frequency: Onset Tuesday for rash, exposure to poison ivy on Sunday Pertinent Negatives: Patient denies other symptoms Disposition: [] ED /[] Urgent Care (no appt availability in office) / [] Appointment(In office/virtual)/ [x]  [facility]/ [] Home Care/ [] Refused Recommended Disposition /[] [facility]/ []  Follow-up with PCP Additional Notes: Advised no OV due to late hour of calling, scheduled virtual UC visit today at 1715.   Reason for Disposition  MODERATE to SEVERE itching (e.g., interferes with work, school, sleep, or other activities)  Answer Assessment - Initial Assessment Questions 1. APPEARANCE of RASH: "Describe the rash."      Blistering rash 2. LOCATION: "Where is the rash located?"  (e.g., face, genitals, hands, legs)     Face and neck 3. SIZE: "How large is the rash?"      Tiny blisters 4. ONSET: "When did the rash begin?"      Tuesday 5. ITCHING: "Does the rash itch?" If Yes, ask: "How bad is it?"   - MILD - doesn't interfere with normal activities   - MODERATE-SEVERE: interferes with work, school, sleep, or other activities      6-7 6. EXPOSURE:  "How were you exposed to the plant (poison ivy, poison oak, sumac)"  "When were you exposed?"       Yes on Sunday 7. PAST HISTORY: "Have you had a poison ivy rash before?" If Yes, ask: "How bad was it?"     Yes  Protocols used: Poison Ivy - Oak - Sumac-A-AH

## 2023-11-22 ENCOUNTER — Ambulatory Visit (INDEPENDENT_AMBULATORY_CARE_PROVIDER_SITE_OTHER): Payer: Managed Care, Other (non HMO) | Admitting: Family Medicine

## 2023-11-22 ENCOUNTER — Encounter: Payer: Self-pay | Admitting: Family Medicine

## 2023-11-22 ENCOUNTER — Other Ambulatory Visit (HOSPITAL_COMMUNITY)
Admission: RE | Admit: 2023-11-22 | Discharge: 2023-11-22 | Disposition: A | Discharge status: Home / Self Care | Source: Ambulatory Visit | Attending: Family Medicine | Admitting: Family Medicine

## 2023-11-22 VITALS — BP 130/81 | HR 60 | Ht 65.0 in | Wt 139.0 lb

## 2023-11-22 DIAGNOSIS — R739 Hyperglycemia, unspecified: Secondary | ICD-10-CM

## 2023-11-22 DIAGNOSIS — N951 Menopausal and female climacteric states: Secondary | ICD-10-CM | POA: Diagnosis not present

## 2023-11-22 DIAGNOSIS — Z13220 Encounter for screening for lipoid disorders: Secondary | ICD-10-CM

## 2023-11-22 DIAGNOSIS — Z1389 Encounter for screening for other disorder: Secondary | ICD-10-CM

## 2023-11-22 DIAGNOSIS — Z130 Encounter for screening for diseases of the blood and blood-forming organs and certain disorders involving the immune mechanism: Secondary | ICD-10-CM

## 2023-11-22 DIAGNOSIS — Z131 Encounter for screening for diabetes mellitus: Secondary | ICD-10-CM

## 2023-11-22 DIAGNOSIS — Z124 Encounter for screening for malignant neoplasm of cervix: Secondary | ICD-10-CM | POA: Diagnosis present

## 2023-11-22 DIAGNOSIS — F17200 Nicotine dependence, unspecified, uncomplicated: Secondary | ICD-10-CM

## 2023-11-22 DIAGNOSIS — Z0000 Encounter for general adult medical examination without abnormal findings: Secondary | ICD-10-CM | POA: Diagnosis not present

## 2023-11-22 DIAGNOSIS — Z1231 Encounter for screening mammogram for malignant neoplasm of breast: Secondary | ICD-10-CM

## 2023-11-22 NOTE — Assessment & Plan Note (Signed)
 Annual physical examination completed. Cervical cancer screening with Pap smear, diabetes screening with A1c, anemia screening with CBC, and cholesterol screening with lipid panel. Engages in 150 minutes of exercise per week, including walking three miles nightly, mowing grass, cleaning the house, and walking dogs. Interested in comprehensive screening due to occupational exposure at [employer]. Urine microscopy ordered to screen for bladder abnormalities due to concern about bladder cancer. - Perform Pap smear for cervical cancer screening - Order A1c, lipid panel, and CBC for routine screening - Recommend 150 minutes of exercise per week - Advise on maintaining a well-balanced diet - Order urine microscopy to screen for bladder abnormalities - Order mammogram for breast cancer screening

## 2023-11-22 NOTE — Patient Instructions (Signed)
 Recommended Vaccines  -Pneumococcal Vaccine

## 2023-11-22 NOTE — Progress Notes (Signed)
 Complete physical exam   Patient: Susan Calderon   DOB: 1966-06-10   58 y.o. Female  MRN: [id number] Visit Date: 11/22/2023  Today's healthcare provider: Mimi Alt, MD   Chief Complaint  Patient presents with   Annual Exam    Needs pap   Subjective    Susan Calderon is a 58 y.o. female who presents today for a complete physical exam.   She reports consuming a general diet.   The patient has a physically strenuous job, but has no regular exercise apart from work.     She does not have additional problems to discuss today.   Discussed the use of AI scribe software for clinical note transcription with the patient, who gave verbal consent to proceed.  History of Present Illness   Discussed the use of AI scribe software for clinical note transcription with the patient, who gave verbal consent to proceed.  History of Present Illness   Susan Calderon is a 58 year old female who presents for her annual physical exam.  She is undergoing her annual physical exam, which includes a Pap smear for cervical cancer screening. She is not interested in STI tests with her Pap smear, only cervical cancer screening.  She works at [employer] and engages in regular physical activity through her job and home activities, such as walking three miles a night, mowing grass, and dog walking. She mentions that she has no testing charge at [facility] due to her employment there.  She has a history of COPD, vasomotor symptoms, menopause, and tobacco use. She is interested in having her kidneys, liver, and stomach checked due to stomach issues. She attempted to schedule a mammogram but was informed that it needed to be ordered.  She mentioned that she is exposed to various things at her workplace, and there was a discussion about screening for HIV and hepatitis C, which she indicated may have been done previously.    \   Past Medical History:  Diagnosis Date   Allergy    Rosacea    Tobacco use disorder     Past Surgical History:  Procedure Laterality Date   BUNIONECTOMY     CESAREAN SECTION     COLONOSCOPY WITH PROPOFOL  N/A 10/15/2017   Procedure: COLONOSCOPY WITH PROPOFOL ;  Surgeon: Luke Salaam, MD;  Location: [facility] ENDOSCOPY;  Service: Gastroenterology;  Laterality: N/A;   TONSILLECTOMY     TUBAL LIGATION     Social History   Socioeconomic History   Marital status: Divorced    Spouse name: Not on file   Number of children: Not on file   Years of education: Not on file   Highest education level: Not on file  Occupational History   Not on file  Tobacco Use   Smoking status: Former    Current packs/day: 1.00    Average packs/day: 1 pack/day for 42.3 years (42.3 ttl pk-yrs)    Types: Cigarettes    Start date: 07/19/1981   Smokeless tobacco: Never  Vaping Use   Vaping status: Never Used  Substance and Sexual Activity   Alcohol use: Yes    Comment: social   Drug use: No   Sexual activity: Never  Other Topics Concern   Not on file  Social History Narrative   Not on file   Social Drivers of Health   Financial Resource Strain: Low Risk  (11/22/2023)   Overall Financial Resource Strain (CARDIA)    Difficulty of Paying Living Expenses: Not hard at all  Food Insecurity: No Food Insecurity (11/22/2023)   Hunger Vital Sign    Worried About Running Out of Food in the Last Year: Never true    Ran Out of Food in the Last Year: Never true  Transportation Needs: No Transportation Needs (11/22/2023)   PRAPARE - Administrator, Civil Service (Medical): No    Lack of Transportation (Non-Medical): No  Physical Activity: Not on file  Stress: No Stress Concern Present (11/22/2023)   [employer] of Occupational Health - Occupational Stress Questionnaire    Feeling of Stress: Not at all  Social Connections: Unknown (03/28/2022)   Received from [facility]   Social Network    Social Network: Not on file  Intimate Partner Violence: Not At Risk (11/22/2023)   Humiliation,  Afraid, Rape, and Kick questionnaire    Fear of Current or Ex-Partner: No    Emotionally Abused: No    Physically Abused: No    Sexually Abused: No   Family Status  Relation Name Status   Mother  Alive   Father  Deceased at age 59   Sister  Alive   Brother  Alive   Daughter  Alive   Son  Alive   MGM  Deceased   MGF  Deceased   PGM  Deceased   PGF  Deceased   Brother  Nature conservation officer   Brother  Alive   Sister  Alive   Neg Hx  (Not Specified)  No partnership data on file   Family History  Problem Relation Age of Onset   Hypothyroidism Mother    Osteoporosis Mother    Hypertension Mother    Heart attack Father    CAD Father    Stroke Maternal Grandmother    Breast cancer Neg Hx    Colon cancer Neg Hx    Allergies  Allergen Reactions   Tree Extract     unknown     Medications: Outpatient Medications Prior to Visit  Medication Sig   fluticasone -salmeterol (ADVAIR HFA) 115-21 MCG/ACT inhaler Inhale 2 puffs into the lungs 2 (two) times daily.   PARoxetine  (PAXIL ) 10 MG tablet Take 1 tablet (10 mg total) by mouth at bedtime.   predniSONE  (STERAPRED UNI-PAK 21 TAB) 10 MG (21) TBPK tablet 6 day taper; take as directed on package instructions   triamcinolone  (KENALOG ) 0.025 % ointment Apply 1 Application topically 2 (two) times daily.   No facility-administered medications prior to visit.    Review of Systems  Last CBC Lab Results  Component Value Date   WBC 6.6 11/16/2022   HGB 12.8 11/16/2022   HCT 38.8 11/16/2022   MCV 97 11/16/2022   MCH 31.9 11/16/2022   RDW 12.3 11/16/2022   PLT 246 11/16/2022   Last metabolic panel Lab Results  Component Value Date   GLUCOSE 104 (H) 11/16/2022   NA 138 11/16/2022   K 3.6 11/16/2022   CL 103 11/16/2022   CO2 23 11/16/2022   BUN 16 11/16/2022   CREATININE 0.79 11/16/2022   EGFR 88 11/16/2022   CALCIUM 9.3 11/16/2022   PROT 6.6 11/16/2022   ALBUMIN 4.7 11/16/2022   LABGLOB 1.9 11/16/2022   AGRATIO 2.5  (H) 11/16/2022   BILITOT 0.4 11/16/2022   ALKPHOS 76 11/16/2022   AST 17 11/16/2022   ALT 19 11/16/2022   ANIONGAP 4 (L) 04/27/2017   Last lipids Lab Results  Component Value Date   CHOL 164 11/16/2022   HDL 78 11/16/2022  LDLCALC 70 11/16/2022   TRIG 90 11/16/2022   CHOLHDL 2.1 11/16/2022   Last hemoglobin A1c Lab Results  Component Value Date   HGBA1C 5.8 (H) 11/16/2022   Last thyroid functions Lab Results  Component Value Date   TSH 1.030 11/16/2022       Objective    BP 130/81   Pulse 60   Ht 5' 5 (1.651 m)   Wt 139 lb (63 kg)   LMP 09/12/2017 Comment: Negative urine pregnancy 10-15-2017  SpO2 99%   BMI 23.13 kg/m  BP Readings from Last 3 Encounters:  11/22/23 130/81  11/16/22 106/67  04/21/22 118/78   Wt Readings from Last 3 Encounters:  11/22/23 139 lb (63 kg)  11/16/22 139 lb (63 kg)  04/21/22 141 lb (64 kg)        Physical Exam Vitals reviewed.  Constitutional:      General: She is not in acute distress.    Appearance: Normal appearance. She is not ill-appearing, toxic-appearing or diaphoretic.  HENT:     Head: Normocephalic and atraumatic.     Right Ear: Tympanic membrane and external ear normal. There is no impacted cerumen.     Left Ear: Tympanic membrane and external ear normal. There is no impacted cerumen.     Nose: Nose normal.     Mouth/Throat:     Pharynx: Oropharynx is clear.   Eyes:     General: No scleral icterus.    Extraocular Movements: Extraocular movements intact.     Conjunctiva/sclera: Conjunctivae normal.     Pupils: Pupils are equal, round, and reactive to light.    Cardiovascular:     Rate and Rhythm: Normal rate and regular rhythm.     Pulses: Normal pulses.     Heart sounds: Normal heart sounds. No murmur heard.    No friction rub. No gallop.  Pulmonary:     Effort: Pulmonary effort is normal. No respiratory distress.     Breath sounds: Normal breath sounds. No wheezing, rhonchi or rales.  Abdominal:      General: Bowel sounds are normal. There is no distension.     Palpations: Abdomen is soft. There is no mass.     Tenderness: There is no abdominal tenderness. There is no guarding.   Musculoskeletal:        General: No deformity.     Cervical back: Normal range of motion and neck supple.     Right lower leg: No edema.     Left lower leg: No edema.  Lymphadenopathy:     Cervical: No cervical adenopathy.   Skin:    General: Skin is warm.     Capillary Refill: Capillary refill takes less than 2 seconds.     Findings: No erythema or rash.   Neurological:     General: No focal deficit present.     Mental Status: She is alert and oriented to person, place, and time.     Cranial Nerves: Cranial nerves 2-12 are intact. No cranial nerve deficit or facial asymmetry.     Motor: Motor function is intact. No weakness.     Gait: Gait normal.   Psychiatric:        Mood and Affect: Mood normal.        Behavior: Behavior normal.       Last depression screening scores    11/22/2023    8:34 AM 11/16/2022    9:26 AM 03/17/2022    8:35 AM  PHQ 2/9 Scores  PHQ - 2 Score 0 0 0  PHQ- 9 Score 0 0 0    Last fall risk screening    11/16/2022    9:26 AM  Fall Risk   Falls in the past year? 0  Number falls in past yr: 0  Injury with Fall? 0    Last Audit-C alcohol use screening    11/22/2023    8:26 AM  Alcohol Use Disorder Test (AUDIT)  1. How often do you have a drink containing alcohol? 1  2. How many drinks containing alcohol do you have on a typical day when you are drinking? 0  3. How often do you have six or more drinks on one occasion? 0  AUDIT-C Score 1   A score of 3 or more in women, and 4 or more in men indicates increased risk for alcohol abuse, EXCEPT if all of the points are from question 1   No results found for any visits on 11/22/23.  Assessment & Plan    Routine Health Maintenance and Physical Exam  Immunization History  Administered Date(s) Administered    Influenza,inj,Quad PF,6+ Mos 06/29/2020, 03/28/2021, 03/17/2022   Influenza-Unspecified 03/22/2016   PFIZER(Purple Top)SARS-COV-2 Vaccination 10/24/2019, 11/21/2019, 06/29/2020   Pneumococcal Polysaccharide-23 03/11/2008   Td 03/21/2006   Tdap 04/28/2016   Zoster Recombinant(Shingrix ) 11/16/2022, 01/31/2023    Health Maintenance  Topic Date Due   Pneumococcal Vaccine 1-42 Years old (2 of 2 - PCV) 03/11/2009   COVID-19 Vaccine (4 - 2024-25 season) 02/11/2023   Cervical Cancer Screening (HPV/Pap Cotest)  10/10/2023   INFLUENZA VACCINE  01/11/2024   Lung Cancer Screening  01/18/2024   MAMMOGRAM  12/19/2024   DTaP/Tdap/Td (3 - Td or Tdap) 04/28/2026   Colonoscopy  10/16/2027   Hepatitis C Screening  Completed   HIV Screening  Completed   Zoster Vaccines- Shingrix   Completed   HPV VACCINES  Aged Out   Meningococcal B Vaccine  Aged Out    Problem List Items Addressed This Visit       Other   Vasomotor symptoms due to menopause   Relevant Orders   CMP14+EGFR   Tobacco use disorder   Elevated blood sugar   Relevant Orders   Hemoglobin A1c   CMP14+EGFR   Annual physical exam - Primary   Annual physical examination completed. Cervical cancer screening with Pap smear, diabetes screening with A1c, anemia screening with CBC, and cholesterol screening with lipid panel. Engages in 150 minutes of exercise per week, including walking three miles nightly, mowing grass, cleaning the house, and walking dogs. Interested in comprehensive screening due to occupational exposure at [employer]. Urine microscopy ordered to screen for bladder abnormalities due to concern about bladder cancer. - Perform Pap smear for cervical cancer screening - Order A1c, lipid panel, and CBC for routine screening - Recommend 150 minutes of exercise per week - Advise on maintaining a well-balanced diet - Order urine microscopy to screen for bladder abnormalities - Order mammogram for breast cancer screening           Other Visit Diagnoses       Screening for cervical cancer       Relevant Orders   Cytology - PAP     Screening for deficiency anemia       Relevant Orders   CBC     Screening for diabetes mellitus       Relevant Orders   Hemoglobin A1c     Screening for lipid disorders  Relevant Orders   Lipid panel     Encounter for screening mammogram for malignant neoplasm of breast       Relevant Orders   MM 3D SCREENING MAMMOGRAM BILATERAL BREAST     Screening for blood or protein in urine       Relevant Orders   Urinalysis, Routine w reflex microscopic        Assessment & Plan      Menopause with Vasomotor Symptoms Menopause with vasomotor symptoms. No treatment plan discussed during this visit.  Chronic Obstructive Pulmonary Disease (COPD) COPD. No treatment plan discussed during this visit.  Tobacco Use Tobacco use. No cessation plan discussed during this visit.   No follow-ups on file.       Mimi Alt, MD  [facility] [phone] (phone) [phone] (fax)  [facility]

## 2023-11-23 ENCOUNTER — Ambulatory Visit: Payer: Self-pay | Admitting: Family Medicine

## 2023-11-23 LAB — HEMOGLOBIN A1C
Est. average glucose Bld gHb Est-mCnc: 120 mg/dL
Hgb A1c MFr Bld: 5.8 % — ABNORMAL HIGH (ref 4.8–5.6)

## 2023-11-23 LAB — CBC
Hematocrit: 41.4 % (ref 34.0–46.6)
Hemoglobin: 13.6 g/dL (ref 11.1–15.9)
MCH: 31.6 pg (ref 26.6–33.0)
MCHC: 32.9 g/dL (ref 31.5–35.7)
MCV: 96 fL (ref 79–97)
Platelets: 236 10*3/uL (ref 150–450)
RBC: 4.31 x10E6/uL (ref 3.77–5.28)
RDW: 11.8 % (ref 11.7–15.4)
WBC: 4.7 10*3/uL (ref 3.4–10.8)

## 2023-11-23 LAB — CMP14+EGFR
ALT: 16 IU/L (ref 0–32)
AST: 24 IU/L (ref 0–40)
Albumin: 4.9 g/dL (ref 3.8–4.9)
Alkaline Phosphatase: 98 IU/L (ref 44–121)
BUN/Creatinine Ratio: 16 (ref 9–23)
BUN: 15 mg/dL (ref 6–24)
Bilirubin Total: 0.4 mg/dL (ref 0.0–1.2)
CO2: 21 mmol/L (ref 20–29)
Calcium: 9.6 mg/dL (ref 8.7–10.2)
Chloride: 99 mmol/L (ref 96–106)
Creatinine, Ser: 0.94 mg/dL (ref 0.57–1.00)
Globulin, Total: 2 g/dL (ref 1.5–4.5)
Glucose: 83 mg/dL (ref 70–99)
Potassium: 3.7 mmol/L (ref 3.5–5.2)
Sodium: 138 mmol/L (ref 134–144)
Total Protein: 6.9 g/dL (ref 6.0–8.5)
eGFR: 71 mL/min/{1.73_m2} (ref >59)

## 2023-11-23 LAB — LIPID PANEL
Chol/HDL Ratio: 2.4 ratio (ref 0.0–4.4)
Cholesterol, Total: 164 mg/dL (ref 100–199)
HDL: 69 mg/dL (ref >39)
LDL Chol Calc (NIH): 81 mg/dL (ref 0–99)
Triglycerides: 74 mg/dL (ref 0–149)
VLDL Cholesterol Cal: 14 mg/dL (ref 5–40)

## 2023-11-23 LAB — URINALYSIS, ROUTINE W REFLEX MICROSCOPIC
Bilirubin, UA: NEGATIVE
Glucose, UA: NEGATIVE
Nitrite, UA: NEGATIVE
Protein,UA: NEGATIVE
RBC, UA: NEGATIVE
Specific Gravity, UA: 1.026 (ref 1.005–1.030)
Urobilinogen, Ur: 0.2 mg/dL (ref 0.2–1.0)
pH, UA: 6 (ref 5.0–7.5)

## 2023-11-23 LAB — SPECIMEN STATUS REPORT

## 2023-11-23 LAB — MICROSCOPIC EXAMINATION
Bacteria, UA: NONE SEEN
Casts: NONE SEEN /LPF

## 2023-11-27 LAB — CYTOLOGY - PAP
Comment: NEGATIVE
Diagnosis: NEGATIVE
High risk HPV: NEGATIVE

## 2023-12-21 ENCOUNTER — Ambulatory Visit
Admission: RE | Admit: 2023-12-21 | Discharge: 2023-12-21 | Disposition: A | Discharge status: Home / Self Care | Source: Ambulatory Visit | Attending: Family Medicine

## 2023-12-21 DIAGNOSIS — Z1231 Encounter for screening mammogram for malignant neoplasm of breast: Secondary | ICD-10-CM | POA: Diagnosis present

## 2024-01-03 ENCOUNTER — Encounter: Payer: Self-pay | Admitting: Acute Care
